# Patient Record
Sex: Male | Born: 1955
Health system: Southern US, Community
[De-identification: ages and names within clinical notes are randomized; demographics above are authoritative.]

## PROBLEM LIST (undated history)

## (undated) DIAGNOSIS — G8929 Other chronic pain: Secondary | ICD-10-CM

## (undated) DIAGNOSIS — Z87442 Personal history of urinary calculi: Secondary | ICD-10-CM

## (undated) DIAGNOSIS — R39198 Other difficulties with micturition: Secondary | ICD-10-CM

## (undated) DIAGNOSIS — M545 Low back pain, unspecified: Secondary | ICD-10-CM

## (undated) DIAGNOSIS — D759 Disease of blood and blood-forming organs, unspecified: Secondary | ICD-10-CM

## (undated) DIAGNOSIS — R3912 Poor urinary stream: Secondary | ICD-10-CM

## (undated) DIAGNOSIS — R519 Headache, unspecified: Secondary | ICD-10-CM

## (undated) DIAGNOSIS — G43909 Migraine, unspecified, not intractable, without status migrainosus: Secondary | ICD-10-CM

## (undated) DIAGNOSIS — M1990 Unspecified osteoarthritis, unspecified site: Secondary | ICD-10-CM

## (undated) DIAGNOSIS — R079 Chest pain, unspecified: Secondary | ICD-10-CM

## (undated) DIAGNOSIS — G2581 Restless legs syndrome: Secondary | ICD-10-CM

## (undated) DIAGNOSIS — J189 Pneumonia, unspecified organism: Secondary | ICD-10-CM

## (undated) DIAGNOSIS — K219 Gastro-esophageal reflux disease without esophagitis: Secondary | ICD-10-CM

## (undated) DIAGNOSIS — R112 Nausea with vomiting, unspecified: Secondary | ICD-10-CM

## (undated) DIAGNOSIS — K259 Gastric ulcer, unspecified as acute or chronic, without hemorrhage or perforation: Secondary | ICD-10-CM

## (undated) DIAGNOSIS — Z9889 Other specified postprocedural states: Secondary | ICD-10-CM

## (undated) DIAGNOSIS — R51 Headache: Secondary | ICD-10-CM

## (undated) DIAGNOSIS — R310 Gross hematuria: Secondary | ICD-10-CM

## (undated) DIAGNOSIS — I10 Essential (primary) hypertension: Secondary | ICD-10-CM

## (undated) HISTORY — PX: CERVICAL FUSION: SHX112

## (undated) HISTORY — DX: Headache: R51

## (undated) HISTORY — DX: Gross hematuria: R31.0

## (undated) HISTORY — DX: Low back pain, unspecified: M54.50

## (undated) HISTORY — DX: Restless legs syndrome: G25.81

## (undated) HISTORY — DX: Other difficulties with micturition: R39.198

## (undated) HISTORY — DX: Essential (primary) hypertension: I10

## (undated) HISTORY — DX: Gastric ulcer, unspecified as acute or chronic, without hemorrhage or perforation: K25.9

## (undated) HISTORY — PX: HERNIA REPAIR: SHX51

## (undated) HISTORY — DX: Poor urinary stream: R39.12

## (undated) HISTORY — DX: Headache, unspecified: R51.9

## (undated) HISTORY — DX: Chest pain, unspecified: R07.9

## (undated) HISTORY — PX: COLONOSCOPY: SHX5424

## (undated) HISTORY — DX: Migraine, unspecified, not intractable, without status migrainosus: G43.909

## (undated) HISTORY — DX: Other chronic pain: G89.29

---

## 1998-01-21 ENCOUNTER — Encounter: Admission: RE | Admit: 1998-01-21 | Discharge: 1998-01-21 | Payer: Self-pay | Admitting: Family Medicine

## 1998-03-12 ENCOUNTER — Encounter: Admission: RE | Admit: 1998-03-12 | Discharge: 1998-03-12 | Payer: Self-pay | Admitting: *Deleted

## 1998-03-25 ENCOUNTER — Encounter: Admission: RE | Admit: 1998-03-25 | Discharge: 1998-03-25 | Payer: Self-pay | Admitting: *Deleted

## 1998-11-26 ENCOUNTER — Encounter: Admission: RE | Admit: 1998-11-26 | Discharge: 1998-11-26 | Payer: Self-pay | Admitting: Family Medicine

## 2000-03-25 ENCOUNTER — Encounter: Admission: RE | Admit: 2000-03-25 | Discharge: 2000-03-25 | Payer: Self-pay | Admitting: Family Medicine

## 2000-07-08 ENCOUNTER — Encounter: Admission: RE | Admit: 2000-07-08 | Discharge: 2000-07-08 | Payer: Self-pay | Admitting: Family Medicine

## 2000-09-08 ENCOUNTER — Encounter: Admission: RE | Admit: 2000-09-08 | Discharge: 2000-09-08 | Payer: Self-pay | Admitting: Family Medicine

## 2001-08-03 ENCOUNTER — Encounter: Admission: RE | Admit: 2001-08-03 | Discharge: 2001-08-03 | Payer: Self-pay | Admitting: Family Medicine

## 2001-12-20 ENCOUNTER — Encounter: Admission: RE | Admit: 2001-12-20 | Discharge: 2001-12-20 | Payer: Self-pay | Admitting: Family Medicine

## 2002-05-08 ENCOUNTER — Encounter (HOSPITAL_BASED_OUTPATIENT_CLINIC_OR_DEPARTMENT_OTHER): Payer: Self-pay | Admitting: General Surgery

## 2002-05-10 ENCOUNTER — Ambulatory Visit (HOSPITAL_COMMUNITY): Admission: RE | Admit: 2002-05-10 | Discharge: 2002-05-10 | Payer: Self-pay | Admitting: General Surgery

## 2002-10-04 ENCOUNTER — Encounter: Admission: RE | Admit: 2002-10-04 | Discharge: 2002-10-04 | Payer: Self-pay | Admitting: Family Medicine

## 2003-04-11 ENCOUNTER — Encounter: Admission: RE | Admit: 2003-04-11 | Discharge: 2003-04-11 | Payer: Self-pay | Admitting: Sports Medicine

## 2003-07-28 HISTORY — PX: NASAL SINUS SURGERY: SHX719

## 2003-11-07 ENCOUNTER — Encounter: Admission: RE | Admit: 2003-11-07 | Discharge: 2003-11-07 | Payer: Self-pay | Admitting: Family Medicine

## 2003-11-23 ENCOUNTER — Encounter: Admission: RE | Admit: 2003-11-23 | Discharge: 2003-11-23 | Payer: Self-pay | Admitting: Sports Medicine

## 2003-11-30 ENCOUNTER — Encounter: Admission: RE | Admit: 2003-11-30 | Discharge: 2003-11-30 | Payer: Self-pay | Admitting: Family Medicine

## 2004-06-04 ENCOUNTER — Ambulatory Visit: Payer: Self-pay | Admitting: Family Medicine

## 2004-07-01 ENCOUNTER — Ambulatory Visit: Payer: Self-pay | Admitting: Sports Medicine

## 2004-12-20 ENCOUNTER — Emergency Department (HOSPITAL_COMMUNITY): Admission: EM | Admit: 2004-12-20 | Discharge: 2004-12-20 | Payer: Self-pay | Admitting: Emergency Medicine

## 2005-01-13 ENCOUNTER — Ambulatory Visit: Payer: Self-pay | Admitting: Family Medicine

## 2005-02-02 ENCOUNTER — Ambulatory Visit: Payer: Self-pay | Admitting: Family Medicine

## 2005-03-03 ENCOUNTER — Ambulatory Visit: Payer: Self-pay | Admitting: Family Medicine

## 2005-03-23 ENCOUNTER — Ambulatory Visit: Payer: Self-pay | Admitting: Family Medicine

## 2005-07-09 ENCOUNTER — Ambulatory Visit: Payer: Self-pay | Admitting: Sports Medicine

## 2006-09-10 ENCOUNTER — Ambulatory Visit: Payer: Self-pay | Admitting: Family Medicine

## 2006-09-23 DIAGNOSIS — G43909 Migraine, unspecified, not intractable, without status migrainosus: Secondary | ICD-10-CM | POA: Insufficient documentation

## 2006-09-23 DIAGNOSIS — Z8719 Personal history of other diseases of the digestive system: Secondary | ICD-10-CM

## 2006-09-23 DIAGNOSIS — I10 Essential (primary) hypertension: Secondary | ICD-10-CM | POA: Insufficient documentation

## 2006-09-23 DIAGNOSIS — J309 Allergic rhinitis, unspecified: Secondary | ICD-10-CM | POA: Insufficient documentation

## 2006-09-23 DIAGNOSIS — Z8711 Personal history of peptic ulcer disease: Secondary | ICD-10-CM | POA: Insufficient documentation

## 2006-11-23 ENCOUNTER — Telehealth: Payer: Self-pay | Admitting: *Deleted

## 2006-11-29 ENCOUNTER — Ambulatory Visit: Payer: Self-pay | Admitting: Family Medicine

## 2006-12-27 ENCOUNTER — Ambulatory Visit: Payer: Self-pay | Admitting: Sports Medicine

## 2007-01-06 ENCOUNTER — Encounter: Payer: Self-pay | Admitting: Family Medicine

## 2007-01-10 ENCOUNTER — Ambulatory Visit: Payer: Self-pay | Admitting: Family Medicine

## 2007-01-13 ENCOUNTER — Encounter: Payer: Self-pay | Admitting: Family Medicine

## 2007-01-18 ENCOUNTER — Encounter: Payer: Self-pay | Admitting: Family Medicine

## 2007-01-20 ENCOUNTER — Encounter (INDEPENDENT_AMBULATORY_CARE_PROVIDER_SITE_OTHER): Payer: Self-pay | Admitting: *Deleted

## 2007-01-20 ENCOUNTER — Encounter: Payer: Self-pay | Admitting: Family Medicine

## 2007-02-28 ENCOUNTER — Telehealth: Payer: Self-pay | Admitting: *Deleted

## 2007-05-13 ENCOUNTER — Encounter: Payer: Self-pay | Admitting: Family Medicine

## 2007-06-08 ENCOUNTER — Telehealth: Payer: Self-pay | Admitting: *Deleted

## 2007-07-18 ENCOUNTER — Ambulatory Visit: Payer: Self-pay | Admitting: Sports Medicine

## 2007-07-18 ENCOUNTER — Encounter: Payer: Self-pay | Admitting: Family Medicine

## 2007-07-18 DIAGNOSIS — M542 Cervicalgia: Secondary | ICD-10-CM | POA: Insufficient documentation

## 2007-07-18 LAB — CONVERTED CEMR LAB
ALT: 34 U/L
AST: 25 U/L
Albumin: 4.7 g/dL
Alkaline Phosphatase: 61 U/L
BUN: 13 mg/dL
CO2: 26 meq/L
Calcium: 9.4 mg/dL
Chloride: 104 meq/L
Creatinine, Ser: 0.98 mg/dL
Direct LDL: 173 mg/dL — ABNORMAL HIGH
Glucose, Bld: 79 mg/dL
Potassium: 4.5 meq/L
Sodium: 142 meq/L
TSH: 1.689 u[IU]/mL
Total Bilirubin: 0.5 mg/dL
Total Protein: 7.2 g/dL

## 2007-08-01 ENCOUNTER — Encounter: Payer: Self-pay | Admitting: Family Medicine

## 2007-11-18 ENCOUNTER — Encounter: Payer: Self-pay | Admitting: *Deleted

## 2007-11-30 ENCOUNTER — Ambulatory Visit: Payer: Self-pay | Admitting: Family Medicine

## 2008-01-02 ENCOUNTER — Ambulatory Visit: Payer: Self-pay | Admitting: Family Medicine

## 2008-01-02 ENCOUNTER — Encounter: Payer: Self-pay | Admitting: Family Medicine

## 2008-01-02 LAB — CONVERTED CEMR LAB
Cholesterol: 180 mg/dL (ref 0–200)
HDL: 40 mg/dL (ref >39)
LDL Cholesterol: 105 mg/dL — ABNORMAL HIGH (ref 0–99)
PSA: 0.52 ng/mL (ref 0.10–4.00)
Total CHOL/HDL Ratio: 4.5
Triglycerides: 175 mg/dL — ABNORMAL HIGH (ref <150)
VLDL: 35 mg/dL (ref 0–40)

## 2008-01-04 ENCOUNTER — Encounter: Payer: Self-pay | Admitting: Family Medicine

## 2008-04-20 ENCOUNTER — Ambulatory Visit: Payer: Self-pay | Admitting: Family Medicine

## 2008-06-15 ENCOUNTER — Encounter: Payer: Self-pay | Admitting: Family Medicine

## 2008-10-26 ENCOUNTER — Encounter: Payer: Self-pay | Admitting: Family Medicine

## 2008-10-26 ENCOUNTER — Ambulatory Visit: Payer: Self-pay | Admitting: Family Medicine

## 2008-10-26 LAB — CONVERTED CEMR LAB
BUN: 22 mg/dL (ref 6–23)
CO2: 24 meq/L (ref 19–32)
Calcium: 8.6 mg/dL (ref 8.4–10.5)
Chloride: 110 meq/L (ref 96–112)
Creatinine, Ser: 0.94 mg/dL (ref 0.40–1.50)
Glucose, Bld: 141 mg/dL — ABNORMAL HIGH (ref 70–99)
Magnesium: 1.9 mg/dL (ref 1.5–2.5)
Potassium: 3.9 meq/L (ref 3.5–5.3)
Sodium: 143 meq/L (ref 135–145)

## 2008-10-30 ENCOUNTER — Telehealth: Payer: Self-pay | Admitting: Family Medicine

## 2009-03-12 ENCOUNTER — Ambulatory Visit: Payer: Self-pay | Admitting: Family Medicine

## 2009-03-14 ENCOUNTER — Ambulatory Visit: Payer: Self-pay | Admitting: Family Medicine

## 2009-03-14 ENCOUNTER — Encounter: Payer: Self-pay | Admitting: Family Medicine

## 2009-03-15 LAB — CONVERTED CEMR LAB
ALT: 26 units/L (ref 0–53)
AST: 21 units/L (ref 0–37)
Albumin: 4.1 g/dL (ref 3.5–5.2)
Alkaline Phosphatase: 50 units/L (ref 39–117)
BUN: 15 mg/dL (ref 6–23)
CO2: 22 meq/L (ref 19–32)
Calcium: 8.5 mg/dL (ref 8.4–10.5)
Chloride: 106 meq/L (ref 96–112)
Cholesterol: 188 mg/dL (ref 0–200)
Creatinine, Ser: 0.84 mg/dL (ref 0.40–1.50)
Glucose, Bld: 102 mg/dL — ABNORMAL HIGH (ref 70–99)
HCT: 47.4 % (ref 39.0–52.0)
HDL: 42 mg/dL (ref >39)
Hemoglobin: 15.6 g/dL (ref 13.0–17.0)
LDL Cholesterol: 104 mg/dL — ABNORMAL HIGH (ref 0–99)
MCHC: 32.9 g/dL (ref 30.0–36.0)
MCV: 87.6 fL (ref 78.0–100.0)
PSA: 0.45 ng/mL (ref 0.10–4.00)
Platelets: 166 10*3/uL (ref 150–400)
Potassium: 4.6 meq/L (ref 3.5–5.3)
RBC: 5.41 M/uL (ref 4.22–5.81)
RDW: 13.5 % (ref 11.5–15.5)
Sodium: 141 meq/L (ref 135–145)
Total Bilirubin: 0.3 mg/dL (ref 0.3–1.2)
Total CHOL/HDL Ratio: 4.5
Total Protein: 6.4 g/dL (ref 6.0–8.3)
Triglycerides: 208 mg/dL — ABNORMAL HIGH (ref <150)
VLDL: 42 mg/dL — ABNORMAL HIGH (ref 0–40)
WBC: 4.6 10*3/uL (ref 4.0–10.5)

## 2009-04-08 ENCOUNTER — Ambulatory Visit: Payer: Self-pay | Admitting: Family Medicine

## 2009-04-08 ENCOUNTER — Encounter: Payer: Self-pay | Admitting: Family Medicine

## 2009-04-10 ENCOUNTER — Telehealth: Payer: Self-pay | Admitting: Family Medicine

## 2009-06-12 ENCOUNTER — Ambulatory Visit: Payer: Self-pay | Admitting: Family Medicine

## 2009-06-12 ENCOUNTER — Encounter: Payer: Self-pay | Admitting: Family Medicine

## 2009-06-13 LAB — CONVERTED CEMR LAB
BUN: 17 mg/dL (ref 6–23)
CO2: 24 meq/L (ref 19–32)
Calcium: 9.3 mg/dL (ref 8.4–10.5)
Chloride: 107 meq/L (ref 96–112)
Creatinine, Ser: 1.01 mg/dL (ref 0.40–1.50)
Glucose, Bld: 84 mg/dL (ref 70–99)
Potassium: 4.2 meq/L (ref 3.5–5.3)
Sodium: 141 meq/L (ref 135–145)

## 2009-07-02 ENCOUNTER — Ambulatory Visit: Payer: Self-pay | Admitting: Family Medicine

## 2009-07-16 ENCOUNTER — Ambulatory Visit: Payer: Self-pay | Admitting: Family Medicine

## 2009-07-16 ENCOUNTER — Encounter: Payer: Self-pay | Admitting: Family Medicine

## 2009-07-17 LAB — CONVERTED CEMR LAB
BUN: 19 mg/dL (ref 6–23)
CO2: 29 meq/L (ref 19–32)
Calcium: 9.7 mg/dL (ref 8.4–10.5)
Chloride: 99 meq/L (ref 96–112)
Creatinine, Ser: 0.88 mg/dL (ref 0.40–1.50)
Glucose, Bld: 84 mg/dL (ref 70–99)
Potassium: 4 meq/L (ref 3.5–5.3)
Sodium: 140 meq/L (ref 135–145)

## 2009-08-30 ENCOUNTER — Telehealth: Payer: Self-pay | Admitting: Family Medicine

## 2009-11-26 ENCOUNTER — Telehealth: Payer: Self-pay | Admitting: Family Medicine

## 2009-12-11 ENCOUNTER — Ambulatory Visit: Payer: Self-pay | Admitting: Family Medicine

## 2009-12-11 ENCOUNTER — Encounter: Payer: Self-pay | Admitting: Family Medicine

## 2009-12-11 DIAGNOSIS — S81809A Unspecified open wound, unspecified lower leg, initial encounter: Secondary | ICD-10-CM | POA: Insufficient documentation

## 2009-12-11 DIAGNOSIS — S81009A Unspecified open wound, unspecified knee, initial encounter: Secondary | ICD-10-CM | POA: Insufficient documentation

## 2009-12-11 DIAGNOSIS — S91009A Unspecified open wound, unspecified ankle, initial encounter: Secondary | ICD-10-CM

## 2009-12-12 LAB — CONVERTED CEMR LAB
BUN: 16 mg/dL (ref 6–23)
CO2: 24 meq/L (ref 19–32)
Calcium: 9.4 mg/dL (ref 8.4–10.5)
Chloride: 102 meq/L (ref 96–112)
Creatinine, Ser: 0.88 mg/dL (ref 0.40–1.50)
Glucose, Bld: 82 mg/dL (ref 70–99)
Potassium: 3.9 meq/L (ref 3.5–5.3)
Sodium: 141 meq/L (ref 135–145)

## 2010-02-11 ENCOUNTER — Telehealth (INDEPENDENT_AMBULATORY_CARE_PROVIDER_SITE_OTHER): Payer: Self-pay | Admitting: *Deleted

## 2010-04-02 ENCOUNTER — Ambulatory Visit: Payer: Self-pay | Admitting: Family Medicine

## 2010-05-20 ENCOUNTER — Ambulatory Visit: Payer: Self-pay | Admitting: Family Medicine

## 2010-06-27 ENCOUNTER — Encounter (INDEPENDENT_AMBULATORY_CARE_PROVIDER_SITE_OTHER): Payer: Self-pay | Admitting: *Deleted

## 2010-07-15 ENCOUNTER — Encounter: Payer: Self-pay | Admitting: Family Medicine

## 2010-08-28 NOTE — Progress Notes (Signed)
Summary: Amerge 2.5mg  prn #10 x0  Phone Note Refill Request Call back at Home Phone [phone] Call back at [phone] Message from:  Patient  Refills Requested: Medication #1:  AMERGE 2.5 MG TABS Take 1 tablet by mouth [facility] [address] [facility] states they have sent a request in and hasn't heard from [place] yet.   Initial call taken by: De Nurse,  August 30, 2009 10:25 AM  Follow-up for Phone Call        to pcp Follow-up by: Golden Circle RN,  August 30, 2009 10:58 AM  Additional Follow-up for Phone Call Additional follow up Details #1::        Pt calling again about his refill. Additional Follow-up by: Clydell Hakim,  September 02, 2009 1:50 PM    New/Updated Medications: AMERGE 2.5 MG TABS (NARATRIPTAN HCL) Take 1 tablet by mouth as needed for migraine, may repeat x 1 after 4hr if partial response or HA recurs Prescriptions: AMERGE 2.5 MG TABS (NARATRIPTAN HCL) Take 1 tablet by mouth as needed for migraine, may repeat x 1 after 4hr if partial response or HA recurs  #10 x 0   Entered and Authorized by:   Marisue Ivan  MD   Signed by:   Marisue Ivan  MD on 09/02/2009   Method used:   [facility] to        [facility]  [address] [id number]       [address]       [place], [place]  [address]       Ph: [phone] or [phone]       Fax: [phone]   RxID:   [id number]

## 2010-08-28 NOTE — Progress Notes (Signed)
Summary: phn msg  Phone Note Call from Patient Call back at [facility] Phone [phone]   Caller: Patient Summary of Call: Pt states he is returning Lynn's call. Initial call taken by: Clydell Hakim,  February 11, 2010 11:29 AM  Follow-up for Phone Call        spoke with patient an advised that rx that were received from pharmacy were refilled but please call and schedule appointment with new MD soon as Dr. Burnadette Pop had suggetsed follow up at last office visit. he voices understanding. Follow-up by: Theresia Lo RN,  February 11, 2010 12:28 PM

## 2010-08-28 NOTE — Miscellaneous (Signed)
Summary: sent medical records  Clinical Lists Changes  1 year of med recs sent to [facility] for eval of pt for Concealed Handgun Permit. De Nurse  June 27, 2010 12:13 PM

## 2010-08-28 NOTE — Progress Notes (Signed)
Summary: Inquire of BP  Phone Note Outgoing Call   Call placed by: Marisue Ivan  MD,  Nov 26, 2009 9:54 AM Summary of Call: Called patient to inquire about toleration of inc HCTZ and control of BP.  No answer.  Left message that I would refill the med x 3 months and he needs to call back to let us know how his BP is doing.  He will need an office visit soon to reassess. Initial call taken by: Marisue Ivan  MD,  Nov 26, 2009 9:55 AM

## 2010-08-28 NOTE — Miscellaneous (Signed)
Summary: Doxepin refill  Clinical Lists Changes  Medications: Rx of DOXEPIN HCL 50 MG CAPS (DOXEPIN HCL) 1 tab by mouth daily;  #90 x 3;  Signed;  Entered by: Majel Homer MD;  Authorized by: Majel Homer MD;  Method used: [facility]  [address] [id number] [address] [place], [place], [place]  [address], Ph: [phone] or [phone], Fax: [phone]    Prescriptions: DOXEPIN HCL 50 MG CAPS (DOXEPIN HCL) 1 tab by mouth daily  #90 x 3   Entered and Authorized by:   Majel Homer MD   Signed by:   Majel Homer MD on 07/15/2010   Method used:   [facility]        [facility]  [address] [id number]       [address]       [place], [place]  [address]       Ph: [phone] or [phone]       Fax: [phone]   RxID:   [id number]

## 2010-08-28 NOTE — Assessment & Plan Note (Signed)
Summary: f/u htn and new leg wound   Vital Signs:  Patient profile:   55 year old male Height:      68 inches Weight:      158.9 pounds BMI:     24.25 Temp:     98.1 degrees F oral Pulse rate:   62 / minute BP sitting:   124 / 82  (left arm) Cuff size:   regular  Vitals Entered By: Gladstone Pih (Dec 11, 2009 4:30 PM) CC: F/U HTN Is Patient Diabetic? No Pain Assessment Patient in pain? no        Primary Care Provider:  Marisue Ivan  MD  CC:  F/U HTN.  History of Present Illness: 55yo M here for f/u htn  HTN: No adverse effects from medication.  Checking it regularly and ranging b/w 110-130s systolic and 70-90s diastolic.  No dizziness, HA, CP, palpitations, or swelling.  Concerned b/c he has strict requirements by his job.  Leg abrasion: Blunt trauma to his shins 1 week ago.  Has a wound on both shins that are healing but has developed redness and pain of the skin adjacent to the wound.  No drainage, fever, or chills.  Brusing present draining to his ankles.   Habits & Providers  Alcohol-Tobacco-Diet     Tobacco Status: never  Current Medications (verified): 1)  Amerge 2.5 Mg Tabs (Naratriptan Hcl) .... Take 1 Tablet By Mouth As Needed For Migraine, May Repeat X 1 After 4hr If Partial Response or Ha Recurs 2)  Doxepin Hcl 50 Mg Caps (Doxepin Hcl) .Marland Kitchen.. 1 Tab By Mouth Daily 3)  Methocarbamol 500 Mg Tabs (Methocarbamol) .... Take 1 Tablet By Mouth At Bedtime As Needed For Muscle Spasms 4)  Nadolol 160 Mg Tabs (Nadolol) .Marland Kitchen.. 1 Tab By Mouth Daily 5)  Flonase 50 Mcg/act  Susp (Fluticasone Propionate) .... 2 Sprays Each Nostril Daily 6)  Ropinirole Hcl 0.25 Mg Tabs (Ropinirole Hcl) .Marland Kitchen.. 1 Tab By Mouth 1-2 Hours Before Bedtime For Restless Leg Syndrome As Needed 7)  Hydrochlorothiazide 25 Mg Tabs (Hydrochlorothiazide) .... 1/2 Tab By Mouth Daily For Blood Pressure 8)  Aspir-Low 81 Mg Tbec (Aspirin) .Marland Kitchen.. 1 Tablet By Mouth Daily 9)  Lisinopril 10 Mg Tabs (Lisinopril) .Marland Kitchen..  1 Tab By Mouth Daily 10)  Doxycycline Hyclate 100 Mg Tabs (Doxycycline Hyclate) .Marland Kitchen.. 1 Tab By Mouth Two Times A Day X 10 Days  Allergies (verified): 1)  ! Prozac 2)  ! Quinine  Review of Systems      See HPI  Physical Exam  General:  VS reviewed.  Well appearing, NAD Lungs:  Normal respiratory effort, chest expands symmetrically. Lungs are clear to auscultation, no crackles or wheezes. Heart:  Normal rate and regular rhythm. S1 and S2 normal without gallop, murmur, click, rub or other extra sounds. Extremities:  no edema 10 cm linear abrasion on the right that is scabbed over and dry with moderate surrounding erythema that is mildly ttp 5cm linear abrasio non the left that is the same as above   Impression & Recommendations:  Problem # 1:  HYPERTENSION, BENIGN SYSTEMIC (ICD-401.1) Assessment Unchanged Essentially at goal (<140/90) Has occasional readings that are elevated.  Plan to dec the HCTZ to 12.5mg  and start on lisinopril 10mg  with plans to reassess in 2 weeks and combine the meds if tolerated and effectie. Will check BMET today and in 2 weeks.  His updated medication list for this problem includes:    Nadolol 160 Mg Tabs (Nadolol) .Marland KitchenMarland KitchenMarland KitchenMarland Kitchen 1  tab by mouth daily    Hydrochlorothiazide 25 Mg Tabs (Hydrochlorothiazide) .Marland Kitchen... 1/2 tab by mouth daily for blood pressure    Lisinopril 10 Mg Tabs (Lisinopril) .Marland Kitchen... 1 tab by mouth daily  Orders: T-Basic Metabolic Panel [id number]) [facility]- Est Level  3 ([address])  Problem # 2:  WOUND, LEG (ICD-891.0) Assessment: New  Appears to be healing but may be developing acute early cellulitis.  Will provide a rx for doxy in case it worsens.  Orders: [facility]- Est Level  3 ([address])  Complete Medication List: 1)  Amerge 2.5 Mg Tabs (Naratriptan hcl) .... Take 1 tablet by mouth as needed for migraine, may repeat x 1 after 4hr if partial response or ha recurs 2)  Doxepin Hcl 50 Mg Caps (Doxepin hcl) .Marland Kitchen.. 1 tab by mouth daily 3)  Methocarbamol 500 Mg  Tabs (Methocarbamol) .... Take 1 tablet by mouth at bedtime as needed for muscle spasms 4)  Nadolol 160 Mg Tabs (Nadolol) .Marland Kitchen.. 1 tab by mouth daily 5)  Flonase 50 Mcg/act Susp (Fluticasone propionate) .... 2 sprays each nostril daily 6)  Ropinirole Hcl 0.25 Mg Tabs (Ropinirole hcl) .Marland Kitchen.. 1 tab by mouth 1-2 hours before bedtime for restless leg syndrome as needed 7)  Hydrochlorothiazide 25 Mg Tabs (Hydrochlorothiazide) .... 1/2 tab by mouth daily for blood pressure 8)  Aspir-low 81 Mg Tbec (Aspirin) .Marland Kitchen.. 1 tablet by mouth daily 9)  Lisinopril 10 Mg Tabs (Lisinopril) .Marland Kitchen.. 1 tab by mouth daily 10)  Doxycycline Hyclate 100 Mg Tabs (Doxycycline hyclate) .Marland Kitchen.. 1 tab by mouth two times a day x 10 days  Patient Instructions: 1)  Call me in 2 weeks to discuss your blood pressure and we'll discuss combining the lisinopril and HCTZ. 2)  Today we started you on lisinopril for better bp control. 3)  I gave you an antibiotic called doxycycline in case your leg appears worse or if you have pain and fever 4)  we'll check your kidney function in 2 weeks. Prescriptions: DOXYCYCLINE HYCLATE 100 MG TABS (DOXYCYCLINE HYCLATE) 1 tab by mouth two times a day x 10 days  #20 x 0   Entered and Authorized by:   Marisue Ivan  MD   Signed by:   Marisue Ivan  MD on 12/11/2009   Method used:   [facility]  [address] [id number]       [address]       [place], [place]  [address]       Ph: [phone] or [phone]       Fax: [phone]   RxID:   [id number] LISINOPRIL 10 MG TABS (LISINOPRIL) 1 tab by mouth daily  #30 x 1   Entered and Authorized by:   Marisue Ivan  MD   Signed by:   Marisue Ivan  MD on 12/11/2009   Method used:   [facility]        [facility]  [address] [id number]       [address]       [place], [place]  [address]       Ph: [phone] or [phone]       Fax: [phone]   RxID:    [id number]    Prevention & Chronic Care Immunizations   Influenza vaccine: Fluvax 3+  (06/12/2009)   Influenza vaccine due: 04/20/2009    Tetanus booster: 07/18/2007: Tdap  Tetanus booster due: 07/17/2017    Pneumococcal vaccine: Not documented  Colorectal Screening   Hemoccult: Done.  (05/27/2004)   Hemoccult due: Not Indicated    Colonoscopy: Small internal hemorrhoids Location:  [facility].     (06/06/2008)   Colonoscopy action/deferral: Repeat colonoscopy in 10 years.    (06/06/2008)   Colonoscopy due: 06/06/2018  Other Screening   PSA: 0.45  (03/14/2009)   PSA due due: 01/01/2009   Smoking status: never  (12/11/2009)  Lipids   Total Cholesterol: 188  (03/14/2009)   LDL: 104  (03/14/2009)   LDL Direct: 173  (07/18/2007)   HDL: 42  (03/14/2009)   Triglycerides: 208  (03/14/2009)  Hypertension   Last Blood Pressure: 124 / 82  (12/11/2009)   Serum creatinine: 0.88  (07/16/2009)   BMP action: Ordered   Serum potassium 4.0  (07/16/2009)    Hypertension flowsheet reviewed?: Yes   Progress toward BP goal: At goal  Self-Management Support :   Personal Goals (by the next clinic visit) :      Personal blood pressure goal: 140/90  (06/12/2009)   Patient will work on the following items until the next clinic visit to reach self-care goals:     Medications and monitoring: take my medicines every day, check my blood pressure, bring all of my medications to every visit  (12/11/2009)     Eating: drink diet soda or water instead of juice or soda, eat more vegetables, use fresh or frozen vegetables, eat foods that are low in salt, eat baked foods instead of fried foods, eat fruit for snacks and desserts, limit or avoid alcohol  (12/11/2009)    Hypertension self-management support: BP self-monitoring log, Written self-care plan, Education handout  (12/11/2009)   Hypertension self-care plan printed.   Hypertension education handout printed

## 2010-08-28 NOTE — Assessment & Plan Note (Signed)
Summary: flu shot,df  Nurse Visit   Vital Signs:  Patient profile:   55 year old male Temp:     98.6 degrees F  Vitals Entered By: Theresia Lo RN (May 20, 2010 11:51 AM)  Allergies: 1)  ! Prozac 2)  ! Quinine  Immunizations Administered:  Influenza Vaccine # 1:    Vaccine Type: Fluvax 3+    Site: left deltoid    Mfr: [employer]    Dose: 0.5 ml    Route: IM    Given by: Theresia Lo RN    Exp. Date: 01/21/2011    Lot #: [id number]    VIS given: 02/18/10 version given May 20, 2010.  Flu Vaccine Consent Questions:    Do you have a history of severe allergic reactions to this vaccine? no    Any prior history of allergic reactions to egg and/or gelatin? no    Do you have a sensitivity to the preservative Thimersol? no    Do you have a past history of Guillan-Barre Syndrome? no    Do you currently have an acute febrile illness? no    Have you ever had a severe reaction to latex? no    Vaccine information given and explained to patient? yes  Orders Added: 1)  Flu Vaccine 1yrs + [90658] 2)  Admin 1st Vaccine [28413]

## 2010-08-28 NOTE — Assessment & Plan Note (Signed)
Summary: meet new doc,df   Vital Signs:  Patient profile:   55 year old male Weight:      160 pounds Temp:     99.1 degrees F oral Pulse rate:   63 / minute Pulse rhythm:   regular BP sitting:   110 / 73  (left arm) Cuff size:   regular  Vitals Entered By: Loralee Pacas CMA (April 02, 2010 2:18 PM)   Primary Provider:  Majel Homer MD   History of Present Illness: Pleasant 55 year old gentleman who presents for an annual physical exam and medication refills.  He reports no new health problems, no chest pain, no shortness of breath, no changes in his chronic left upper extremity pain, and no changes in his chronic migraines.  His blood pressure has remained well controlled on lisinopril, and he reports that his doxepin and nadolol continue to work well for migraine prophylaxsis (he only uses rescue medications  ~once per month).  Habits & Providers  Alcohol-Tobacco-Diet     Tobacco Status: never  Current Medications (verified): 1)  Amerge 2.5 Mg Tabs (Naratriptan Hcl) .... Take 1 Tablet By Mouth As Needed For Migraine, May Repeat X 1 After 4hr If Partial Response or Ha Recurs 2)  Doxepin Hcl 50 Mg Caps (Doxepin Hcl) .Marland Kitchen.. 1 Tab By Mouth Daily 3)  Methocarbamol 500 Mg Tabs (Methocarbamol) .... Take 1 Tablet By Mouth At Bedtime As Needed For Muscle Spasms 4)  Nadolol 160 Mg Tabs (Nadolol) .Marland Kitchen.. 1 Tab By Mouth Daily 5)  Flonase 50 Mcg/act  Susp (Fluticasone Propionate) .... 2 Sprays Each Nostril Daily 6)  Ropinirole Hcl 0.25 Mg Tabs (Ropinirole Hcl) .Marland Kitchen.. 1 Tab By Mouth 1-2 Hours Before Bedtime For Restless Leg Syndrome As Needed 7)  Aspir-Low 81 Mg Tbec (Aspirin) .Marland Kitchen.. 1 Tablet By Mouth Daily 8)  Lisinopril 10 Mg Tabs (Lisinopril) .Marland Kitchen.. 1 Tab By Mouth Daily 9)  Doxycycline Hyclate 100 Mg Tabs (Doxycycline Hyclate) .Marland Kitchen.. 1 Tab By Mouth Two Times A Day X 10 Days 10)  Etodolac 200 Mg Caps (Etodolac)  Allergies: 1)  ! Prozac 2)  ! Quinine PMH-FH-SH reviewed for relevance  Review of  Systems       The patient complains of headaches.  The patient denies chest pain, syncope, dyspnea on exertion, peripheral edema, melena, and hematuria.    Physical Exam  General:  Well-developed,well-nourished,in no acute distress; alert,appropriate and cooperative throughout examination Head:  Normocephalic and atraumatic without obvious abnormalities. No apparent alopecia or balding. Nose:  External nasal examination shows no deformity or inflammation. Nasal mucosa are pink and moist without lesions or exudates. Mouth:  Oral mucosa and oropharynx without lesions or exudates.  Teeth in good repair. Neck:  No deformities, masses, or tenderness noted. Lungs:  Normal respiratory effort, chest expands symmetrically. Lungs are clear to auscultation, no crackles or wheezes. Heart:  Normal rate and regular rhythm. S1 and S2 normal without gallop, murmur, click, rub or other extra sounds. Abdomen:  Bowel sounds positive,abdomen soft and non-tender without masses, organomegaly or hernias noted. Msk:  No deformity or scoliosis noted of thoracic or lumbar spine.   Extremities:  No clubbing, cyanosis, edema, or deformity noted with normal full range of motion of all joints.     Impression & Recommendations:  Problem # 1:  HYPERTENSION, BENIGN SYSTEMIC (ICD-401.1) Assessment Improved  The patient brought his blood pressure log with him today and his blood pressure seems to be very well controlled on his lisinopril.  He  expresses a preference for this over HCTZ.  We discussed the fact that he is well controlled on his current medication and that we can stop the HCTZ.  The following medications were removed from the medication list:    Hydrochlorothiazide 25 Mg Tabs (Hydrochlorothiazide) .Marland Kitchen... 1/2 tab by mouth daily for blood pressure His updated medication list for this problem includes:    Nadolol 160 Mg Tabs (Nadolol) .Marland Kitchen... 1 tab by mouth daily    Lisinopril 10 Mg Tabs (Lisinopril) .Marland Kitchen... 1 tab by  mouth daily  Orders: [facility]- Est Level  3 ([address])  Problem # 2:  CERVICAL RADICULOPATHY (ICD-723.4) Assessment: Unchanged The patient says that his pain remains unchanged from his previous visit.  He is not interested in surgery at this time and understands the need for PT exercises.  He says he does these regularly and does not express interest in any other interventions at this time.  Will continue him on his methocarbamol as needed for the pain.  Problem # 3:  HEALTH MAINTENANCE EXAM (ICD-V70.0)  Discussed with the patient that he is up to date on his screening exams and that he is not due for any blood work at this time.  He was instructed to call the clinic in one month to set up a time to get his annual flu shot.  Orders: [facility]- Est Level  3 ([address])  Complete Medication List: 1)  Amerge 2.5 Mg Tabs (Naratriptan hcl) .... Take 1 tablet by mouth as needed for migraine, may repeat x 1 after 4hr if partial response or ha recurs 2)  Doxepin Hcl 50 Mg Caps (Doxepin hcl) .Marland Kitchen.. 1 tab by mouth daily 3)  Methocarbamol 500 Mg Tabs (Methocarbamol) .... Take 1 tablet by mouth at bedtime as needed for muscle spasms 4)  Nadolol 160 Mg Tabs (Nadolol) .Marland Kitchen.. 1 tab by mouth daily 5)  Flonase 50 Mcg/act Susp (Fluticasone propionate) .... 2 sprays each nostril daily 6)  Ropinirole Hcl 0.25 Mg Tabs (Ropinirole hcl) .Marland Kitchen.. 1 tab by mouth 1-2 hours before bedtime for restless leg syndrome as needed 7)  Aspir-low 81 Mg Tbec (Aspirin) .Marland Kitchen.. 1 tablet by mouth daily 8)  Lisinopril 10 Mg Tabs (Lisinopril) .Marland Kitchen.. 1 tab by mouth daily 9)  Doxycycline Hyclate 100 Mg Tabs (Doxycycline hyclate) .Marland Kitchen.. 1 tab by mouth two times a day x 10 days 10)  Etodolac 200 Mg Caps (Etodolac)  Patient Instructions: 1)  It was great to see you today! 2)  I have sent your prescriptions to the pharmacy. 3)  Please come back to see me in 6 months, or sooner if needed.   Prevention & Chronic Care Immunizations   Influenza vaccine: Fluvax 3+   (06/12/2009)   Influenza vaccine due: 04/20/2009    Tetanus booster: 07/18/2007: Tdap   Tetanus booster due: 07/17/2017    Pneumococcal vaccine: Not documented  Colorectal Screening   Hemoccult: Done.  (05/27/2004)   Hemoccult due: Not Indicated    Colonoscopy: Small internal hemorrhoids Location:  [facility].     (06/06/2008)   Colonoscopy action/deferral: Repeat colonoscopy in 10 years.    (06/06/2008)   Colonoscopy due: 06/06/2018  Other Screening   PSA: 0.45  (03/14/2009)   PSA due due: 01/01/2009   Smoking status: never  (04/02/2010)  Lipids   Total Cholesterol: 188  (03/14/2009)   LDL: 104  (03/14/2009)   LDL Direct: 173  (07/18/2007)   HDL: 42  (03/14/2009)   Triglycerides: 208  (03/14/2009)  Hypertension   Last  Blood Pressure: 110 / 73  (04/02/2010)   Serum creatinine: 0.88  (12/11/2009)   BMP action: Ordered   Serum potassium 3.9  (12/11/2009)    Hypertension flowsheet reviewed?: Yes   Progress toward BP goal: At goal  Self-Management Support :   Personal Goals (by the next clinic visit) :      Personal blood pressure goal: 140/90  (06/12/2009)   Hypertension self-management support: BP self-monitoring log, Written self-care plan, Education handout  (12/11/2009)

## 2010-11-12 ENCOUNTER — Other Ambulatory Visit: Payer: Self-pay | Admitting: Family Medicine

## 2010-11-12 MED ORDER — NARATRIPTAN HCL 2.5 MG PO TABS: ORAL_TABLET | ORAL | Status: DC

## 2010-11-12 NOTE — Telephone Encounter (Signed)
Additional refill requests prior to around July 1 will require appointment to talk about migraines.

## 2010-12-12 NOTE — Op Note (Signed)
NAME:  Jose Shea, Jose Shea                           ACCOUNT NO.:  [id number]   MEDICAL RECORD NO.:  [id number]                   PATIENT TYPE:  OIB   LOCATION:  2858                                 FACILITY:  [facility]   PHYSICIAN:  Leonie Man, MD                  DATE OF BIRTH:  [birth year]   DATE OF PROCEDURE:  05/10/2002  DATE OF DISCHARGE:  05/10/2002                                 OPERATIVE REPORT   PREOPERATIVE DIAGNOSIS:  Recurrent left inguinal hernia.   POSTOPERATIVE DIAGNOSIS:  Recurrent left inguinal hernia.   OPERATION PERFORMED:  Repair of recurrent left inguinal hernia with mesh.   SURGEON:  Leonie Man, MD   ASSISTANT:  Nurse   ANESTHESIA:  General.   INDICATIONS FOR SURGERY:  The patient is a 55 year old  man presenting with  a recurrent left inguinal hernia, presenting in the pubic tubercle.  He is  brought to the operating room now for repair following discussion of the  risks and potential benefits of surgery and he gave consent after all  questions were answered.   DESCRIPTION OF OPERATION:  Following the induction of satisfactory  anesthesia the patient was positioned supine and the left groin was prepped  and draped to be included in a sterile operative field.  The old scar  cicatrix was excised in an elliptical incision and discarded.  The  dissection was carried down through the subcutaneous tissues to the external  oblique aponeurosis.  It was a rather tedious dissection to open the  external oblique aponeurosis and to expose the underlying spermatic cord,  which was dissected free from Hesselbach's triangle, thus exposing the  hernial defect.  The previous repair was not done with any mesh that was  currently visible.  The defect was then patched with a polypropylene plug  and then over the plug the entire aspect of Hesselbach's triangle was  buttressed with a large mesh patch going up the pubic tubercle, carried up  along the conjoined tendon to the  internal ring, then again from the pubic  tubercle along the shelving edge of Poupart's ligament to the internal ring.  The repair was noted to be stable and firm.   Sponge, instrument and sharp counts were verified.   The spermatic cord returned to its anatomic position and the external  oblique aponeurosis was closed over the spermatic cord after second  verification of the sponge and instrument counts.  Subcutaneous tissues were  closed with a running 3-0 Vicryl suture and the skin closed with a running 4-  0 Monocryl suture, and then reinforced with Steri-strips.  Sterile dressings  were applied.   Anesthetic reversed.  The patient was removed from the operating room to the  recovery room in stable condition.  He tolerated the procedure well.  Leonie Man, MD    PB/MEDQ  D:  05/10/2002  T:  05/10/2002  Job:  [id number]

## 2011-03-31 ENCOUNTER — Other Ambulatory Visit: Payer: Self-pay | Admitting: Family Medicine

## 2011-03-31 NOTE — Telephone Encounter (Signed)
Refill request

## 2011-04-01 ENCOUNTER — Encounter: Payer: Self-pay | Admitting: Family Medicine

## 2011-04-01 ENCOUNTER — Ambulatory Visit (INDEPENDENT_AMBULATORY_CARE_PROVIDER_SITE_OTHER): Payer: BC Managed Care – PPO | Admitting: Family Medicine

## 2011-04-01 VITALS — BP 128/82 | HR 76 | Temp 99.3°F | Ht 68.5 in | Wt 158.0 lb

## 2011-04-01 DIAGNOSIS — R05 Cough: Secondary | ICD-10-CM

## 2011-04-01 DIAGNOSIS — R053 Chronic cough: Secondary | ICD-10-CM | POA: Insufficient documentation

## 2011-04-01 DIAGNOSIS — R059 Cough, unspecified: Secondary | ICD-10-CM

## 2011-04-01 MED ORDER — AMOXICILLIN-POT CLAVULANATE 875-125 MG PO TABS: 1.0000 | ORAL_TABLET | Freq: Two times a day (BID) | ORAL | Status: AC

## 2011-04-01 NOTE — Assessment & Plan Note (Signed)
Symptoms most likely chronic sinusitis related to allergies vs bacterial source. Less likely ACEi-induced (patient has been on med for >59yr), or GERD related or pulmonary etiology (no lung exam findings or risk factor for pna). Will treat sinusitis empirically with scheduled cetirizine x 2 weeks and augmentin x 7 days. Will f/u if not resolved in next 2 weeks. Would consider chest x-ray and trial off lisinopril or PPI trial if not improved by then.

## 2011-04-01 NOTE — Patient Instructions (Signed)
Nice to meet you. Take augmentin for next 7 days. Take a daily antihistamine (zyrtec or equivalent) for inflammation. If cough persists after 2 weeks, please call for appointment.

## 2011-04-01 NOTE — Progress Notes (Signed)
  Subjective:    Patient ID: Jose Shea, male    DOB: September 16, 1955, 55 y.o.   MRN: [id number]  HPI  1. Cough. First began in late July ~6-7 weeks ago with a sinus headache, watery eyes and congestion. His congestion improved but cough has continued to linger for several weeks. Now has developed hoarseness. Intermittently productive of yellow sputum. Did have sinus pressure and headache for several hours recently. Having body aches and fatigue. Tried sudafed, mucinex, theraflu without much benefit.   Denies daily heartburn, hemoptysis, wheezing, chest pain, dyspnea, fevers.  Has history of seasonal allergies especially bad this year. Denies smoking.   Review of Systems See HPI otherwise negative.    Objective:   Physical Exam  Vitals reviewed. Constitutional: He is oriented to person, place, and time. He appears well-developed and well-nourished. No distress.       Voice is hoarse.  HENT:  Head: Normocephalic and atraumatic.  Right Ear: External ear normal.  Left Ear: External ear normal.  Nose: Nose normal.       Posterior pharynx has erythema and injection. No lesions or exudates.   Minimal TTP over frontal sinus. No maxillary sinus tenderness.  Eyes: Conjunctivae and EOM are normal. Pupils are equal, round, and reactive to light. Right eye exhibits no discharge. Left eye exhibits no discharge.  Neck: Normal range of motion. No thyromegaly present.       Single <0.5cm ant cervical LAD.  Cardiovascular: Normal rate, regular rhythm and normal heart sounds.   No murmur heard. Pulmonary/Chest: Effort normal and breath sounds normal. No respiratory distress. He has no wheezes. He has no rales. He exhibits no tenderness.  Musculoskeletal: Normal range of motion. He exhibits no edema and no tenderness.  Neurological: He is alert and oriented to person, place, and time.          Assessment & Plan:

## 2011-04-08 ENCOUNTER — Other Ambulatory Visit: Payer: Self-pay | Admitting: Family Medicine

## 2011-04-09 NOTE — Telephone Encounter (Signed)
Refill request

## 2011-06-01 ENCOUNTER — Ambulatory Visit (INDEPENDENT_AMBULATORY_CARE_PROVIDER_SITE_OTHER): Payer: BC Managed Care – PPO

## 2011-06-01 DIAGNOSIS — Z23 Encounter for immunization: Secondary | ICD-10-CM

## 2011-07-08 ENCOUNTER — Other Ambulatory Visit: Payer: Self-pay | Admitting: Family Medicine

## 2011-07-08 NOTE — Telephone Encounter (Signed)
Refill request

## 2011-09-06 ENCOUNTER — Other Ambulatory Visit: Payer: Self-pay | Admitting: Family Medicine

## 2011-09-06 NOTE — Telephone Encounter (Signed)
Refill request

## 2011-09-28 ENCOUNTER — Other Ambulatory Visit: Payer: Self-pay | Admitting: Family Medicine

## 2011-09-28 NOTE — Telephone Encounter (Signed)
Refill request

## 2012-02-17 ENCOUNTER — Other Ambulatory Visit: Payer: Self-pay | Admitting: Family Medicine

## 2012-02-17 MED ORDER — NADOLOL 80 MG PO TABS: ORAL_TABLET | ORAL | Status: DC

## 2012-02-29 ENCOUNTER — Other Ambulatory Visit: Payer: Self-pay | Admitting: *Deleted

## 2012-02-29 MED ORDER — NARATRIPTAN HCL 2.5 MG PO TABS: ORAL_TABLET | ORAL | Status: DC

## 2012-06-03 ENCOUNTER — Other Ambulatory Visit: Payer: Self-pay | Admitting: *Deleted

## 2012-06-03 MED ORDER — DOXEPIN HCL 50 MG PO CAPS: 50.0000 mg | ORAL_CAPSULE | Freq: Every day | ORAL | Status: DC

## 2012-11-08 ENCOUNTER — Other Ambulatory Visit: Payer: Self-pay | Admitting: *Deleted

## 2012-11-08 MED ORDER — FLUTICASONE PROPIONATE 50 MCG/ACT NA SUSP: NASAL | Status: DC

## 2012-11-10 ENCOUNTER — Other Ambulatory Visit: Payer: Self-pay | Admitting: *Deleted

## 2012-11-12 MED ORDER — ROPINIROLE HCL 0.25 MG PO TABS: ORAL_TABLET | ORAL | Status: DC

## 2012-11-12 MED ORDER — METHOCARBAMOL 500 MG PO TABS: ORAL_TABLET | ORAL | Status: DC

## 2012-11-12 MED ORDER — LISINOPRIL 10 MG PO TABS: ORAL_TABLET | ORAL | Status: DC

## 2013-02-23 ENCOUNTER — Other Ambulatory Visit: Payer: Self-pay | Admitting: Family Medicine

## 2013-04-28 ENCOUNTER — Ambulatory Visit (INDEPENDENT_AMBULATORY_CARE_PROVIDER_SITE_OTHER): Payer: BC Managed Care – PPO | Admitting: Family Medicine

## 2013-04-28 ENCOUNTER — Encounter: Payer: Self-pay | Admitting: Family Medicine

## 2013-04-28 VITALS — BP 121/81 | HR 58 | Temp 98.7°F | Ht 68.0 in | Wt 162.5 lb

## 2013-04-28 DIAGNOSIS — M542 Cervicalgia: Secondary | ICD-10-CM

## 2013-04-28 DIAGNOSIS — G43909 Migraine, unspecified, not intractable, without status migrainosus: Secondary | ICD-10-CM

## 2013-04-28 DIAGNOSIS — I10 Essential (primary) hypertension: Secondary | ICD-10-CM

## 2013-04-28 DIAGNOSIS — Z23 Encounter for immunization: Secondary | ICD-10-CM

## 2013-04-28 MED ORDER — NARATRIPTAN HCL 2.5 MG PO TABS: ORAL_TABLET | ORAL | Status: DC

## 2013-04-28 MED ORDER — METHOCARBAMOL 500 MG PO TABS: ORAL_TABLET | ORAL | Status: DC

## 2013-04-28 MED ORDER — NADOLOL 80 MG PO TABS: ORAL_TABLET | ORAL | Status: DC

## 2013-04-28 MED ORDER — LISINOPRIL 10 MG PO TABS: ORAL_TABLET | ORAL | Status: DC

## 2013-04-28 MED ORDER — ROPINIROLE HCL 0.25 MG PO TABS: ORAL_TABLET | ORAL | Status: DC

## 2013-04-28 MED ORDER — FLUTICASONE PROPIONATE 50 MCG/ACT NA SUSP: NASAL | Status: DC

## 2013-04-28 MED ORDER — DOXEPIN HCL 50 MG PO CAPS: 50.0000 mg | ORAL_CAPSULE | Freq: Every day | ORAL | Status: DC

## 2013-04-28 NOTE — Progress Notes (Signed)
Subjective:     Patient ID: Jose Shea, male   DOB: Oct 28, 1955, 57 y.o.   MRN: [id number]  HPI 57 year old male with HTN, allergic rhinitis, and migraines presents for follow up.  1) HTN Disease Monitoring: Home BP Monitoring - yes Chest pain- no    Dyspnea- no Medications:Lisinopril; Nadolol for migraine PPx Compliance-  yes Lightheadedness-  no  Edema- no   2) Migraines - Patient reports that his migraines are well controlled on his current regimen (nadalol and doxepin for PPx; naratriptan for abortive therapy).  - No increase in frequency or severity.  3) Neck pain - Patient reports a longstanding history of neck pain. - He states that it is intermittent.  He states the pain is moderate in severity when it occurs. - Prior history of "bulging discs" - Pain improves with PRN Robaxin. - No associated numbness/tingling or weakness in upper extremities.  History  Substance Use Topics  . Smoking status: Never Smoker   . Smokeless tobacco: Not on file  . Alcohol Use: Not on file    Review of Systems Per HPI    Objective:   Physical Exam Exam: General: well appearing middle aged gentlemen; NAD. Neck: Normal ROM.  Negative Spurling's test. Cardiovascular: RRR. No murmurs, rubs, or gallops. Respiratory: CTAB. No rales, rhonchi, or wheeze. Abdomen: soft, nontender, nondistended. Extremities: No LE edema.     Assessment:     See Problem List    Plan:

## 2013-04-28 NOTE — Assessment & Plan Note (Signed)
At goal. Lisinopril refilled today. Obtaining CMP as patient has not had labs in over a year.

## 2013-04-28 NOTE — Assessment & Plan Note (Signed)
Well controlled. Medications refilled today.

## 2013-04-28 NOTE — Assessment & Plan Note (Signed)
Normal physical exam. Likely MSK in nature. Patient reports that PT has worked well in the past. Will place referral to PT.

## 2013-04-28 NOTE — Patient Instructions (Addendum)
It was nice seeing you today.  You're doing great.  Continue to take your medications as prescribed.  Please return to get your labs drawn (fasting - do not eat after midnight).

## 2013-05-01 ENCOUNTER — Other Ambulatory Visit: Payer: BC Managed Care – PPO

## 2013-05-01 DIAGNOSIS — I10 Essential (primary) hypertension: Secondary | ICD-10-CM

## 2013-05-01 LAB — LIPID PANEL
HDL: 33 mg/dL — ABNORMAL LOW (ref >39)
LDL Cholesterol: 114 mg/dL — ABNORMAL HIGH (ref 0–99)
Triglycerides: 195 mg/dL — ABNORMAL HIGH (ref <150)
VLDL: 39 mg/dL (ref 0–40)

## 2013-05-01 LAB — COMPLETE METABOLIC PANEL WITH GFR
ALT: 27 U/L (ref 0–53)
AST: 22 U/L (ref 0–37)
Albumin: 3.7 g/dL (ref 3.5–5.2)
Alkaline Phosphatase: 47 U/L (ref 39–117)
Calcium: 8.5 mg/dL (ref 8.4–10.5)
Chloride: 105 mEq/L (ref 96–112)
Creat: 0.92 mg/dL (ref 0.50–1.35)
Potassium: 4 mEq/L (ref 3.5–5.3)
Sodium: 142 mEq/L (ref 135–145)
Total Protein: 5.9 g/dL — ABNORMAL LOW (ref 6.0–8.3)

## 2013-05-01 NOTE — Progress Notes (Signed)
CMP AND FLP DONE TODAY Jose Shea 

## 2013-05-04 ENCOUNTER — Encounter: Payer: Self-pay | Admitting: Family Medicine

## 2013-05-15 ENCOUNTER — Ambulatory Visit: Payer: BC Managed Care – PPO | Attending: Family Medicine | Admitting: Physical Therapy

## 2013-05-15 DIAGNOSIS — M25519 Pain in unspecified shoulder: Secondary | ICD-10-CM | POA: Insufficient documentation

## 2013-05-15 DIAGNOSIS — M542 Cervicalgia: Secondary | ICD-10-CM | POA: Insufficient documentation

## 2013-05-15 DIAGNOSIS — IMO0001 Reserved for inherently not codable concepts without codable children: Secondary | ICD-10-CM | POA: Insufficient documentation

## 2013-05-19 ENCOUNTER — Ambulatory Visit (INDEPENDENT_AMBULATORY_CARE_PROVIDER_SITE_OTHER): Payer: BC Managed Care – PPO | Admitting: Family Medicine

## 2013-05-19 ENCOUNTER — Encounter: Payer: Self-pay | Admitting: Family Medicine

## 2013-05-19 VITALS — BP 115/89 | HR 55 | Temp 98.9°F | Ht 68.5 in | Wt 160.0 lb

## 2013-05-19 DIAGNOSIS — E785 Hyperlipidemia, unspecified: Secondary | ICD-10-CM

## 2013-05-19 DIAGNOSIS — I10 Essential (primary) hypertension: Secondary | ICD-10-CM

## 2013-05-19 NOTE — Progress Notes (Signed)
Subjective:     Patient ID: Jose Shea, male   DOB: 1955/10/20, 57 y.o.   MRN: [id number]  HPI 57 year old male with HTN, Neck pain, and migraine presents for follow up.  Patient would like to discuss labs today.  1) HTN  - Well controlled on Lisinopril - No SOB, chest pain, LE edema, dizziness/lightheadedness  2) Recent labs - Lipid panel obtained on 10/6. Results are below: Lipid Panel     Component Value Date/Time   CHOL 186 05/01/2013 0914   TRIG 195* 05/01/2013 0914   HDL 33* 05/01/2013 0914   CHOLHDL 5.6 05/01/2013 0914   VLDL 39 05/01/2013 0914   LDLCALC 114* 05/01/2013 0914   - Patient concerned about elevated cholesterol.  He would like to discuss treatment options today.  Review of Systems Per HPI    Objective:   Physical Exam Exam: General: well appearing male in NAD. Cardiovascular: RRR. No murmurs, rubs, or gallops. Respiratory: CTAB. No rales, rhonchi, or wheeze. Abdomen: soft, nontender, nondistended. Extremities:  No LE edema.    Assessment:     See Problem List    Plan:

## 2013-05-20 DIAGNOSIS — E782 Mixed hyperlipidemia: Secondary | ICD-10-CM | POA: Insufficient documentation

## 2013-05-20 NOTE — Assessment & Plan Note (Signed)
Well controlled. Continue Lisinopril. 

## 2013-05-20 NOTE — Assessment & Plan Note (Signed)
ASCVD 10 year risk - 7.2%. After long discussion regarding treatment, patient elected to pursue Therapeutic Lifestyle changes.  Will re-visit in ~ 6 months.  Will consider statin again at that time.

## 2013-05-22 ENCOUNTER — Ambulatory Visit: Payer: BC Managed Care – PPO | Admitting: Physical Therapy

## 2013-05-24 ENCOUNTER — Ambulatory Visit: Payer: BC Managed Care – PPO | Admitting: Physical Therapy

## 2013-05-29 ENCOUNTER — Ambulatory Visit: Payer: BC Managed Care – PPO | Attending: Family Medicine | Admitting: Physical Therapy

## 2013-05-29 DIAGNOSIS — M542 Cervicalgia: Secondary | ICD-10-CM | POA: Insufficient documentation

## 2013-05-29 DIAGNOSIS — IMO0001 Reserved for inherently not codable concepts without codable children: Secondary | ICD-10-CM | POA: Insufficient documentation

## 2013-05-29 DIAGNOSIS — M25519 Pain in unspecified shoulder: Secondary | ICD-10-CM | POA: Insufficient documentation

## 2013-05-31 ENCOUNTER — Ambulatory Visit: Payer: BC Managed Care – PPO | Admitting: Physical Therapy

## 2013-06-05 ENCOUNTER — Ambulatory Visit: Payer: BC Managed Care – PPO | Admitting: Physical Therapy

## 2013-06-07 ENCOUNTER — Ambulatory Visit: Payer: BC Managed Care – PPO | Admitting: Physical Therapy

## 2013-06-12 ENCOUNTER — Ambulatory Visit: Payer: BC Managed Care – PPO | Admitting: Physical Therapy

## 2013-06-14 ENCOUNTER — Ambulatory Visit: Payer: BC Managed Care – PPO | Admitting: Physical Therapy

## 2014-01-24 ENCOUNTER — Ambulatory Visit (INDEPENDENT_AMBULATORY_CARE_PROVIDER_SITE_OTHER): Payer: BC Managed Care – PPO | Admitting: Family Medicine

## 2014-01-24 VITALS — BP 129/86 | HR 53 | Temp 98.6°F | Ht 68.0 in | Wt 149.0 lb

## 2014-01-24 DIAGNOSIS — I10 Essential (primary) hypertension: Secondary | ICD-10-CM

## 2014-01-24 DIAGNOSIS — E785 Hyperlipidemia, unspecified: Secondary | ICD-10-CM

## 2014-01-24 DIAGNOSIS — M160 Bilateral primary osteoarthritis of hip: Secondary | ICD-10-CM | POA: Insufficient documentation

## 2014-01-24 DIAGNOSIS — Z1159 Encounter for screening for other viral diseases: Secondary | ICD-10-CM

## 2014-01-24 DIAGNOSIS — M25559 Pain in unspecified hip: Secondary | ICD-10-CM

## 2014-01-24 DIAGNOSIS — M25551 Pain in right hip: Secondary | ICD-10-CM

## 2014-01-24 NOTE — Patient Instructions (Signed)
It was nice to see you today.  Continue taking your medications are prescribed.  Follow up labs in October to check your cholesterol.   Get the xray at your convenience.

## 2014-01-24 NOTE — Assessment & Plan Note (Signed)
Appears MSK in nature. Obtaining xray today.

## 2014-01-24 NOTE — Assessment & Plan Note (Signed)
After 5 before office visit was complete. Will obtain in October with repeat lipid panel.

## 2014-01-24 NOTE — Assessment & Plan Note (Signed)
Well controlled.  Continue currently therapy with Lisinopril

## 2014-01-24 NOTE — Progress Notes (Signed)
   Subjective:    Patient ID: Jose Shea, male    DOB: 1956-01-08, 58 y.o.   MRN: [id number]  HPI 58 year old male with HLD and HTN presents for follow up.  1) HTN Disease Monitoring: Home BP Monitoring - yes.  Medications:Lisinopril Compliance -  Yes ROS: Denies chest pain, SOB, lightheadedness/dizziness   2) HCV screening - Patient heard about screening for those in age bracket.  He desires screening. - No risk factors for HCV: No IV drug use, risky sexual practices, tattoos, etc.  3) Right hip pain - Patient has been recently experiencing right hip pain  - Pain is moderate in severity and worse with certain positions while lying down. - Pain is located over the iliac crest.   - No relieving factors; although it does wax and wane and is currently doing well. - No reports of groin pain.  No pain over greater trochanter.  Review of Systems Per HPI    Objective:   Physical Exam Filed Vitals:   01/24/14 1634  BP: 129/86  Pulse: 53  Temp: 98.6 F (37 C)   Exam: General: well appearing, NAD. Cardiovascular: RRR. No murmurs, rubs, or gallops. Respiratory: CTAB. No rales, rhonchi, or wheeze. MSK - Hip: ROM - Full ROM. Pelvic alignment unremarkable to inspection and palpation. Greater trochanter without tenderness to palpation. No pain with FABER or FADIR. No SI joint tenderness and normal minimal SI movement.     Assessment & Plan:  See Problem List

## 2014-02-14 ENCOUNTER — Other Ambulatory Visit: Payer: Self-pay | Admitting: Family Medicine

## 2014-02-14 ENCOUNTER — Ambulatory Visit
Admission: RE | Admit: 2014-02-14 | Discharge: 2014-02-14 | Disposition: A | Discharge status: Home / Self Care | Payer: BC Managed Care – PPO | Source: Ambulatory Visit | Attending: Family Medicine | Admitting: Family Medicine

## 2014-02-14 DIAGNOSIS — M25559 Pain in unspecified hip: Secondary | ICD-10-CM

## 2014-02-14 DIAGNOSIS — M25551 Pain in right hip: Secondary | ICD-10-CM

## 2014-02-21 ENCOUNTER — Telehealth: Payer: Self-pay | Admitting: Family Medicine

## 2014-02-21 NOTE — Telephone Encounter (Signed)
Please inform patient

## 2014-02-21 NOTE — Telephone Encounter (Signed)
Mild degenerative changes of the hip.

## 2014-02-21 NOTE — Telephone Encounter (Signed)
Pt called and wanted to know if the results are back from the x-rays he had taken. jw

## 2014-04-26 ENCOUNTER — Telehealth: Payer: Self-pay | Admitting: Family Medicine

## 2014-04-26 NOTE — Telephone Encounter (Signed)
[employer] has identified pt with chronic health problems. Employer is offering health and wellness program. When the pt comes in for an appt, please tell pt [employer] program

## 2014-05-09 ENCOUNTER — Telehealth: Payer: Self-pay | Admitting: Family Medicine

## 2014-05-09 MED ORDER — BENZONATATE 100 MG PO CAPS: 100.0000 mg | ORAL_CAPSULE | Freq: Three times a day (TID) | ORAL | Status: DC | PRN

## 2014-05-09 NOTE — Telephone Encounter (Signed)
Pt called and would like some cough syrup called in. He is getting over his cold but still has a cough. Please call him if he needs to make an appointment or when the medication is called in. jw

## 2014-05-09 NOTE — Telephone Encounter (Signed)
I called in Tessalon (pill) for his cough.

## 2014-05-14 ENCOUNTER — Encounter: Payer: Self-pay | Admitting: Family Medicine

## 2014-05-14 ENCOUNTER — Ambulatory Visit (INDEPENDENT_AMBULATORY_CARE_PROVIDER_SITE_OTHER): Payer: BC Managed Care – PPO | Admitting: Family Medicine

## 2014-05-14 VITALS — BP 141/98 | HR 64 | Temp 98.9°F | Ht 68.0 in | Wt 145.0 lb

## 2014-05-14 DIAGNOSIS — J069 Acute upper respiratory infection, unspecified: Secondary | ICD-10-CM

## 2014-05-14 DIAGNOSIS — J189 Pneumonia, unspecified organism: Secondary | ICD-10-CM | POA: Insufficient documentation

## 2014-05-14 MED ORDER — GUAIFENESIN-CODEINE 100-10 MG/5ML PO SOLN: 10.0000 mL | Freq: Four times a day (QID) | ORAL | Status: DC | PRN

## 2014-05-14 NOTE — Assessment & Plan Note (Addendum)
Patient with symptoms of viral URI, though given body aches, fever, and sweats possibly had the flu. At this time the patient is improving and persistent cough is his main concern. No pulmonary abnormalities on exam. Well appearing with intermittent cough on exam. Will treat cough with codeine cough syrup. Given return precautions. F/u prn.

## 2014-05-14 NOTE — Progress Notes (Signed)
Patient ID: [name], male   DOB: [birth year], 58 y.o.   MRN: [id number]  Tommi Rumps, MD Phone: [phone]  Jose Shea is a 58 y.o. male who presents today for same day appointment.   Cough: patient reports 2 weeks ago started with congestion, post nasal drip, then developed clear productive cough. Noted feeling feverish initially with body aches and sweats, though did not check his temperature. He notes decreased appetite with this. No ear fullness. He reports over all he is feeling better, though continues to feel weak. Appetite is returning, congestion is gone, no body aches or sweats in over a week. Cough just remains. He tried nyquil, advil sinus, alka seltzer, airborne, mucinex, and tylenol for this. He also tried tessalon with little benefit.   Patient is a nonsmoker.   ROS: Per HPI   Physical Exam Filed Vitals:   05/14/14 0916  BP: 141/98  Pulse: 64  Temp: 98.9 F (37.2 C)    Gen: Well NAD HEENT: PERRL,  MMM, bilateral TMs normal, no OP erythema, no cervical LAD Lungs: CTABL Nl WOB Heart: RRR no MRG Exts: Non edematous BL  LE, warm and well perfused.    Assessment/Plan: Please see individual problem list.   Tommi Rumps, MD [place] PGY-3

## 2014-05-14 NOTE — Patient Instructions (Addendum)
Nice to meet you. If you develop any fever, worsening appetite, cough, shortness or breath, or do not continue to feel improved by the end of the week please let us know.  Please drink plenty of fluids and start back to a bland diet.

## 2014-05-16 ENCOUNTER — Telehealth: Payer: Self-pay | Admitting: Family Medicine

## 2014-05-16 NOTE — Telephone Encounter (Signed)
Will forward to Md to see if he is ok with this extension. Maridee Slape,CMA

## 2014-05-16 NOTE — Telephone Encounter (Signed)
I am fine extending his being off of work through the weekend, though if he is unable to return to work by Monday he will need to come in to be re-evaluated. He should be improving by that time.

## 2014-05-16 NOTE — Telephone Encounter (Signed)
Patient is requesting an extension to his work note. Was seen on Monday by Caryl Bis, and taken out until tomorrow. Pt states he is still not feeling 100% and requesting an extension until Tuesday 05/22/14. Pls advise.

## 2014-05-16 NOTE — Telephone Encounter (Signed)
Pt informed, letter printed and placed up front. Jose Shea, Jose Shea

## 2014-05-21 ENCOUNTER — Ambulatory Visit
Admission: RE | Admit: 2014-05-21 | Discharge: 2014-05-21 | Disposition: A | Discharge status: Home / Self Care | Payer: BC Managed Care – PPO | Source: Ambulatory Visit | Attending: Family Medicine | Admitting: Family Medicine

## 2014-05-21 ENCOUNTER — Encounter: Payer: Self-pay | Admitting: Family Medicine

## 2014-05-21 ENCOUNTER — Other Ambulatory Visit: Payer: Self-pay | Admitting: Family Medicine

## 2014-05-21 ENCOUNTER — Ambulatory Visit (INDEPENDENT_AMBULATORY_CARE_PROVIDER_SITE_OTHER): Payer: BC Managed Care – PPO | Admitting: Family Medicine

## 2014-05-21 ENCOUNTER — Telehealth: Payer: Self-pay | Admitting: *Deleted

## 2014-05-21 VITALS — BP 135/83 | HR 60 | Temp 98.2°F | Ht 68.0 in | Wt 143.0 lb

## 2014-05-21 DIAGNOSIS — R05 Cough: Secondary | ICD-10-CM | POA: Diagnosis not present

## 2014-05-21 DIAGNOSIS — J189 Pneumonia, unspecified organism: Secondary | ICD-10-CM

## 2014-05-21 DIAGNOSIS — R059 Cough, unspecified: Secondary | ICD-10-CM

## 2014-05-21 MED ORDER — DOXYCYCLINE HYCLATE 100 MG PO TABS: 100.0000 mg | ORAL_TABLET | Freq: Two times a day (BID) | ORAL | Status: DC

## 2014-05-21 NOTE — Telephone Encounter (Signed)
[facility] call to report results of chest x-ray completed this AM; Primarily interstitial infiltrate throughout much of the lingula. Results are in EPIC.  Derl Barrow, RN

## 2014-05-21 NOTE — Patient Instructions (Signed)
Nice to see you. You likely have a pneumonia. We will treat you with doxycycline.  Please go get the x-ray. If you develop shortness of breath, changing chest pain, cough blood up please go to the ED.

## 2014-05-22 NOTE — Telephone Encounter (Signed)
Forward to [place].

## 2014-05-22 NOTE — Assessment & Plan Note (Signed)
Patient with continued cough and now with lung sounds consistent with PNA. CXR revealed linugular infiltrate. Patient will take a 7 day course of doxycycline given potential for interaction between his doxepin and azithromycin. To return if not improving on this medication. Given return precautions.

## 2014-05-22 NOTE — Progress Notes (Signed)
Patient ID: Jose Shea, male   DOB: 1956-06-15, 58 y.o.   MRN: [id number]  Tommi Rumps, MD Phone: [phone]  Jose Shea is a 58 y.o. male who presents today for same day appointment.  Cough: Seen 1 week ago for 2 weeks of coughing. Seemed to be improving overall at that time. Now patient with continued cough now productive of green sputum. Has noted sharp pain in right ribs and back with this cough. Pain only occurs when coughing. Not an exertional pain. No radiation of the pain. Mild shortness of breath with coughing, none otherwise. Notes sweating in the middle of the night 2-3x since last being seen. Denies fevers. He tried a variety of medication to help with this though none have proven beneficial.   Patient is a nonsmoker.   ROS: Per HPI   Physical Exam Filed Vitals:   05/21/14 1015  BP: 135/83  Pulse: 60  Temp: 98.2 F (36.8 C)    Gen: NAD HEENT: PERRL,  MMM, bilateral TMs normal, no OP erythema Lungs: left lower lung field with rales, no wheezes Nl WOB Heart: RRR no MRG Exts: Non edematous BL  LE, warm and well perfused.    Assessment/Plan: Please see individual problem list.  Tommi Rumps, MD [place] PGY-3

## 2014-05-25 ENCOUNTER — Telehealth: Payer: Self-pay | Admitting: Family Medicine

## 2014-05-25 NOTE — Telephone Encounter (Signed)
Will forward to MD to see if he will ok this letter. Georg Ang,CMA

## 2014-05-25 NOTE — Telephone Encounter (Signed)
Pt called and would like the letter that was written for his employer to be out of work extended until later next week. Please call when this is up front. He has been seen by Dr. Caryl Bis. jw

## 2014-05-27 ENCOUNTER — Ambulatory Visit (INDEPENDENT_AMBULATORY_CARE_PROVIDER_SITE_OTHER): Payer: BC Managed Care – PPO | Admitting: Family Medicine

## 2014-05-27 VITALS — BP 126/78 | HR 59 | Temp 98.5°F | Resp 18 | Ht 69.0 in | Wt 147.0 lb

## 2014-05-27 DIAGNOSIS — S61502A Unspecified open wound of left wrist, initial encounter: Secondary | ICD-10-CM

## 2014-05-27 DIAGNOSIS — S61512A Laceration without foreign body of left wrist, initial encounter: Secondary | ICD-10-CM

## 2014-05-27 DIAGNOSIS — Z23 Encounter for immunization: Secondary | ICD-10-CM

## 2014-05-27 NOTE — Patient Instructions (Signed)

## 2014-05-27 NOTE — Progress Notes (Signed)
   Subjective:    Patient ID: Jose Shea, male    DOB: [birth year], 58 y.o.   MRN: [id number] This chart was scribed for Reginia Forts, MD by Marti Sleigh, Medical Scribe. This patient was seen in Room 7 and the patient's care was started at 2:13 PM.  HPI HPI Comments: Jose Shea is a 58 y.o. male who presents to [facility] complaining of a laceration to his left wrist that happened this morning. Pt reported to [facility] because his laceration failed to stop bleeding. Pt denies numbness or tingling. Full ROM of wrist without weakness. Was reaching into mailbox that had been vandelized on Halloween with cut to wrist.   Last Tetanus more than 5 years ago.   Review of Systems  Musculoskeletal: Negative for myalgias and arthralgias.  Skin: Positive for wound.  Neurological: Negative for weakness and numbness.   Past Medical History  Diagnosis Date  . Restless leg   . Hypertension   . Chronic headaches    Past Surgical History  Procedure Laterality Date  . Hernia repair     Allergies  Allergen Reactions  . Fluoxetine Hcl   . Quinine         Objective:   Physical Exam  Constitutional: He is oriented to person, place, and time. He appears well-developed and well-nourished.  HENT:  Head: Normocephalic and atraumatic.  Eyes: Pupils are equal, round, and reactive to light.  Neck: No JVD present.  Cardiovascular: Normal rate and regular rhythm.   Capillary refill intact.  Pulmonary/Chest: Effort normal and breath sounds normal. No respiratory distress.  Musculoskeletal:       Left wrist: He exhibits laceration. He exhibits normal range of motion, no tenderness and no bony tenderness.  Neurological: He is alert and oriented to person, place, and time. He has normal strength. No sensory deficit.  Skin: Skin is warm and dry.  6x65mm laceration along the left wrist with good approximation. Full ROM in wrist.  Psychiatric: He has a normal mood and affect. His behavior is normal.  Nursing  note and vitals reviewed.  TDAP ADMINISTERED.     Assessment & Plan:  Wound, open, wrist, left, initial encounter  Need for diphtheria-tetanus-pertussis (Tdap) vaccine - Plan: Tdap vaccine greater than or equal to 7yo IM    1. Wound/laceration L wrist: New.  S/p TDAP. S/p suture repair. RTC 7-10 days for suture removal. Local wound care reviewed in detail. RTC for fever, redness, drainage, or pain. 2.  S/p TDAP.    No orders of the defined types were placed in this encounter.   I personally performed the services described in this documentation, which was scribed in my presence.  The recorded information has been reviewed and is accurate.  Reginia Forts, M.D.  [facility] [address] [place], [place]  [address] [phone] phone [phone] fax

## 2014-05-27 NOTE — Progress Notes (Signed)
Verbal consent obtained from patient.  Local anesthesia with 3cc Lidocaine 1% without epinephrine.  Wound scrubbed with soap and water and rinsed.  Wound closed with #2 4-0 Prolene (#1 HM, #! SI) sutures.  Wound cleansed and dressed.

## 2014-05-28 NOTE — Telephone Encounter (Signed)
I will write a letter keeping him out until 11/4. He will be at the end of his antibiotic course at that time. If he is not feeling up to returning to work at that time he will need to be seen for follow-up in clinic.

## 2014-05-28 NOTE — Telephone Encounter (Signed)
Pt calls again this AM.  He sis not go to work this AM as he is still having some back pain and not feeling well.  Pt states that he also cut his hand this weekend and had to get 2 stiches and a tetanus shot. Fleeger, Salome Spotted

## 2014-05-28 NOTE — Telephone Encounter (Signed)
Pt is agreeable to going back to work on 05/31/2014.  Letter up front for pickup. Lyan Moyano, Salome Spotted

## 2014-06-04 ENCOUNTER — Telehealth: Payer: Self-pay | Admitting: Family Medicine

## 2014-06-04 NOTE — Telephone Encounter (Signed)
Pt dropped off fmla forms to be completed, pt was seen by Dr. Caryl Bis on 05/21/14. Given to [facility] team for clinical completion

## 2014-06-05 ENCOUNTER — Ambulatory Visit (INDEPENDENT_AMBULATORY_CARE_PROVIDER_SITE_OTHER): Payer: BC Managed Care – PPO | Admitting: Family Medicine

## 2014-06-05 VITALS — BP 132/80 | HR 61 | Temp 98.6°F | Resp 17 | Ht 67.5 in | Wt 146.0 lb

## 2014-06-05 DIAGNOSIS — S61502D Unspecified open wound of left wrist, subsequent encounter: Secondary | ICD-10-CM

## 2014-06-05 NOTE — Progress Notes (Signed)
    IDENTIFYING INFORMATION  Jose Shea / DOB: 1956-03-24 / MRN: [id number]  The patient has Migraine; HYPERTENSION, BENIGN SYSTEMIC; RHINITIS, ALLERGIC; History of gastric ulcer; Neck pain; HLD (hyperlipidemia); Need for hepatitis C screening test; Right hip pain; and Community acquired pneumonia on his problem list.  SUBJECTIVE  Chief Complaint: Suture / Staple Removal   History of present illness: [name] is a 58 y.o. year old male who presents for removal of two sutures on the medial/palmar aspect of the left wrist.  He is asymptomatic today, and denies purulence, pain, and swelling at the site of the repair.   He received a Tdap on 05/27/2014.  He  has a past medical history of Restless leg; Hypertension; and Chronic headaches.  The patient has a current medication list which includes the following prescription(s): aspirin, doxepin, doxycycline, fluticasone, guaifenesin-codeine, lisinopril, methocarbamol, nadolol, naratriptan, and ropinirole.  [name] is allergic to fluoxetine hcl and quinine. He  reports that he has never smoked. He does not have any smokeless tobacco history on file. He  has no sexual activity history on file.  The patient  has past surgical history that includes Hernia repair.  His family history is not on file.  Review of Systems  Constitutional: Negative.   Musculoskeletal: Negative for myalgias.  Skin: Negative for itching and rash.    OBJECTIVE  Blood pressure 132/80, pulse 61, temperature 98.6 F (37 C), temperature source Oral, resp. rate 17, height 5' 7.5" (1.715 m), weight 146 lb (66.225 kg), SpO2 95 %. The patient's body mass index is 22.52 kg/(m^2).  Physical Exam  Constitutional: He is well-developed, well-nourished, and in no distress.  Skin: Skin is warm and dry.      Procedure: Verbal consent obtained. Two sutures removed. The patient tolerated the procedure without difficulty.     No results found for this or any previous  visit (from the past 24 hour(s)).  ASSESSMENT & PLAN  Jose Shea was seen today for suture / staple removal.  Diagnoses and associated orders for this visit:  Wound, open, wrist, left, subsequent encounter -     Pt advised to RTC should he begin to have problems.     The patient was instructed to to call or comeback to clinic as needed, or should symptoms warrant.  Philis Fendt, MHS, PA-C [facility] 06/05/2014 5:34 PM

## 2014-06-05 NOTE — Telephone Encounter (Signed)
Placed in Dr. Tharon Aquas box. Fleeger, Salome Spotted

## 2014-06-05 NOTE — Progress Notes (Signed)
Examined the wound and agree that it looks a little inflamed, however with the sutures out I think it will go ahead and resolved. Patient was instructed to return if worse. Discussed with the PA.

## 2014-06-06 NOTE — Telephone Encounter (Signed)
Form filled out and placed in Jose Shea's box.  

## 2014-06-06 NOTE — Telephone Encounter (Signed)
Pt called to give [place] the fax number to use for his FMLA papers [phone] attention Sabrina. He would also like the orginals left up front for pickup. Areatha Keas

## 2014-06-07 NOTE — Telephone Encounter (Signed)
LMOVM informing pt that his form was completed and ready for pick up. Fleeger, Salome Spotted

## 2014-06-13 ENCOUNTER — Telehealth: Payer: Self-pay | Admitting: Family Medicine

## 2014-06-13 NOTE — Telephone Encounter (Signed)
Placed in PCP box for completion 

## 2014-06-13 NOTE — Telephone Encounter (Signed)
Pt brought in forms to be completed by dr regarding his blood pressure Will pick up when ready

## 2014-06-14 NOTE — Telephone Encounter (Signed)
Form filled out and placed on RN Tamika Martin's desk.

## 2014-06-15 NOTE — Telephone Encounter (Signed)
Left message on voicemail that paper work was ready to be picked up.

## 2014-07-22 ENCOUNTER — Other Ambulatory Visit: Payer: Self-pay | Admitting: Family Medicine

## 2014-08-21 ENCOUNTER — Other Ambulatory Visit: Payer: Self-pay | Admitting: Family Medicine

## 2014-10-17 ENCOUNTER — Other Ambulatory Visit: Payer: Self-pay | Admitting: Family Medicine

## 2015-02-18 ENCOUNTER — Other Ambulatory Visit: Payer: Self-pay | Admitting: *Deleted

## 2015-02-18 MED ORDER — NADOLOL 80 MG PO TABS: 160.0000 mg | ORAL_TABLET | Freq: Every day | ORAL | Status: DC

## 2015-02-18 NOTE — Telephone Encounter (Signed)
Patient needs to be seen for HTN f/u before more refills will be given.  Virginia Crews, MD, MPH PGY-2,  [facility] 02/18/2015 12:16 PM

## 2015-02-19 NOTE — Telephone Encounter (Signed)
Pt informed and scheduled an appt. Deseree Blount, CMA  

## 2015-03-11 ENCOUNTER — Other Ambulatory Visit: Payer: BLUE CROSS/BLUE SHIELD

## 2015-03-11 DIAGNOSIS — Z1159 Encounter for screening for other viral diseases: Secondary | ICD-10-CM

## 2015-03-11 DIAGNOSIS — E785 Hyperlipidemia, unspecified: Secondary | ICD-10-CM

## 2015-03-11 LAB — LIPID PANEL
CHOL/HDL RATIO: 4.9 ratio (ref <=5.0)
CHOLESTEROL: 188 mg/dL (ref 125–200)
HDL: 38 mg/dL — ABNORMAL LOW (ref >=40)
LDL CALC: 119 mg/dL (ref <130)
TRIGLYCERIDES: 154 mg/dL — AB (ref <150)
VLDL: 31 mg/dL — ABNORMAL HIGH (ref <30)

## 2015-03-11 NOTE — Progress Notes (Signed)
FLP AND HEP C DONE TODAY Jose Shea

## 2015-03-12 ENCOUNTER — Ambulatory Visit (INDEPENDENT_AMBULATORY_CARE_PROVIDER_SITE_OTHER): Payer: BLUE CROSS/BLUE SHIELD | Admitting: Family Medicine

## 2015-03-12 ENCOUNTER — Encounter: Payer: Self-pay | Admitting: Family Medicine

## 2015-03-12 VITALS — BP 135/83 | HR 52 | Temp 98.3°F | Ht 68.0 in | Wt 147.0 lb

## 2015-03-12 DIAGNOSIS — M129 Arthropathy, unspecified: Secondary | ICD-10-CM | POA: Diagnosis not present

## 2015-03-12 DIAGNOSIS — I10 Essential (primary) hypertension: Secondary | ICD-10-CM

## 2015-03-12 DIAGNOSIS — Z114 Encounter for screening for human immunodeficiency virus [HIV]: Secondary | ICD-10-CM

## 2015-03-12 DIAGNOSIS — M160 Bilateral primary osteoarthritis of hip: Secondary | ICD-10-CM

## 2015-03-12 DIAGNOSIS — Z131 Encounter for screening for diabetes mellitus: Secondary | ICD-10-CM

## 2015-03-12 DIAGNOSIS — E785 Hyperlipidemia, unspecified: Secondary | ICD-10-CM

## 2015-03-12 LAB — HEPATITIS C ANTIBODY: HCV AB: NEGATIVE

## 2015-03-12 NOTE — Progress Notes (Signed)
Subjective:    Patient ID: Jose Shea, male    DOB: 14-Mar-1956, 59 y.o.   MRN: [id number]  Jose Shea is a 59 y.o. male presenting on 03/12/2015 for Hypertension   HPI   CHRONIC HTN: Reports - no concerns, feels well. Does not check BP outside of office Current Meds - Lisinopril 10mg  daily, Nadolol 80mg  BID   Reports good compliance, took meds today. Tolerating well, w/o complaints. Due for BMET today Denies CP, dyspnea, HA, edema, dizziness / lightheadedness  DIABETES SCREENING: - 2 years since last glucose check, no A1c on chart. No prior diagnosis of Pre-DM. - Admits to family history of DM2 - Fasting today  HYPERLIPIDEMIA: - Last fasting lipid panel recently checked 03/11/15 (from future orders, but pt had not drawn until yesterday). Previously had lipids for comparison in 04/2013. - Today patient reports known history of elevated cholesterol, has discussed statin therapy with previous doctors, but has not started, as he had planned to work on improving lifestyle. - Admits to family history of MI in Father approx age 18  ARTHRITIS, BILATERAL HIPS: - Chronic history of intermittent episodes of hip pain, seems to alternate sides. Previously worked up about 1 year ago with bilateral pelvis X-ray showing mild DJD in both hips. Additionally, admits to known h/o DJD in other joints, specifically C4-C6 spine and L4-5 with prior dx bulging discs. - Today requesting to review results of this X-ray - Currently denies any active hip pain and functioning well. 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 Plans to get DOT physical at [facility] in Nov 2016.  Past Medical History  Diagnosis Date  . Restless leg   . Hypertension   . Chronic headaches     Current Outpatient Prescriptions on File Prior to Visit    Medication Sig  . aspirin (ASPIR-LOW) 81 MG EC tablet Take 81 mg by mouth daily.    Marland Kitchen doxepin (SINEQUAN) 50 MG capsule TAKE 1 CAPSULE (50 MG TOTAL) BY MOUTH AT BEDTIME.  . fluticasone (FLONASE) 50 MCG/ACT nasal spray USE 2 SPRAYS INTO EACH NOSTRIL DAILY  . lisinopril (PRINIVIL,ZESTRIL) 10 MG tablet TAKE 1 TABLET BY MOUTH EVERY DAY  . methocarbamol (ROBAXIN) 500 MG tablet TAKE 1 TABLET BY MOUTH AT BEDTIME AS NEEDED FOR MUSCLE SPASMS  . nadolol (CORGARD) 80 MG tablet Take 2 tablets (160 mg total) by mouth daily.  . naratriptan (AMERGE) 2.5 MG tablet TAKE ONE TABLET AT ONSET OF HEADACHE IF RETURNS OR DOES NOT RESOLVE, MAY REPEAT AFTER 4 HOURS  . rOPINIRole (REQUIP) 0.25 MG tablet TAKE 1 TABLET BY MOUTH 1-2 HOURS BEFORE BEDTIME FOR RESTLESS LEG SYNDROME AS NEEDED   No current facility-administered medications on file prior to visit.    Review of Systems  Constitutional: Negative for fever, chills, diaphoresis, activity change, appetite change and fatigue.  HENT: Negative for congestion and hearing loss.   Eyes: Negative for visual disturbance.  Respiratory: Negative for cough, chest tightness, shortness of breath and wheezing.   Cardiovascular: Negative for chest pain, palpitations and leg swelling.  Gastrointestinal: Negative for nausea, vomiting, abdominal pain, diarrhea and constipation.  Endocrine: Negative for polydipsia, polyphagia and polyuria.  Genitourinary: Negative for dysuria, frequency and hematuria.  Musculoskeletal: Negative for arthralgias (intermittent episodes, no active) and neck pain.  Skin: Negative for rash.  Neurological: Negative for dizziness, weakness, light-headedness, numbness and headaches.  Hematological: Negative for adenopathy.   Per HPI unless specifically indicated above     Objective:    BP 135/83 mmHg  Pulse 52  Temp(Src) 98.3 F (36.8 C) (Oral)  Ht 5\' 8"  (1.727 m)  Wt 147 lb (66.679 kg)  BMI 22.36 kg/m2  Wt Readings from Last 3 Encounters:   03/12/15 147 lb (66.679 kg)  06/05/14 146 lb (66.225 kg)  05/27/14 147 lb (66.679 kg)    Physical Exam  Constitutional: He is oriented to person, place, and time. He appears well-developed and well-nourished. No distress.  HENT:  Head: Normocephalic and atraumatic.  Mouth/Throat: Oropharynx is clear and moist.  Eyes: Conjunctivae and EOM are normal. Pupils are equal, round, and reactive to light.  Neck: Normal range of motion. Neck supple. No thyromegaly present.  Cardiovascular: Normal rate, regular rhythm, normal heart sounds and intact distal pulses.   No murmur heard. Pulmonary/Chest: Effort normal and breath sounds normal. No respiratory distress. He has no wheezes. He has no rales.  Abdominal: Soft. There is no tenderness.  Musculoskeletal: Normal range of motion. He exhibits no edema or tenderness.       Right hip: Normal. He exhibits normal range of motion, normal strength, no tenderness and no bony tenderness.       Left hip: Normal. He exhibits normal range of motion, normal strength, no tenderness and no bony tenderness.       Lumbar back: Normal. He exhibits no tenderness and no deformity.  Lymphadenopathy:    He has no cervical adenopathy.  Neurological: He is alert and oriented to person, place, and time.  Skin: Skin is warm and dry. No rash noted. He is not diaphoretic.  Nursing note and vitals reviewed.  Results for orders placed or performed in visit on 95/09/32  BASIC METABOLIC PANEL WITH GFR  Result Value Ref Range   Sodium 138 135 - 146 mmol/L   Potassium 4.5 3.5 - 5.3 mmol/L   Chloride 102 98 - 110 mmol/L   CO2 25 20 - 31 mmol/L   Glucose, Bld 84 65 - 99 mg/dL   BUN 18 7 - 25 mg/dL   Creat 0.84 0.70 - 1.33 mg/dL   Calcium 9.4 8.6 - 10.3 mg/dL   GFR, Est African American >89 >=60 mL/min   GFR, Est Non African American >89 >=60 mL/min  HIV antibody  Result Value Ref Range   HIV 1&2 Ab, 4th Generation NONREACTIVE NONREACTIVE      Assessment & Plan:    Problem List Items Addressed This Visit      Cardiovascular and Mediastinum   HYPERTENSION, BENIGN SYSTEMIC - Primary    Controlled HTN, at goal. Note HR is low normal at 52 on Nadolol BB No complications   Plan:  1. Continue current BP meds.  2. Check BMET (fasting) monitor SCr (stable @ 0.84)  3. Lifestyle Mods - Improve regular exercise, Dec salt intake, inc K+ rich vegs 4. Monitor BP at home or at drug store occasionally.       Relevant Orders   BASIC METABOLIC PANEL WITH GFR (Completed)     Musculoskeletal and Integument   Bilateral hip joint arthritis    Stable w/o pain today. H/o intermittent flares, alternating sides. Consistent with OA. - Last X-ray 7/15 with mild DJD (reviewed today)  Plan: 1. Continue conservative therapy with Tylenol PRN, reviewed appropriate dosage 2. Relative rest, heating pad, ice packs PRN 3. F/u PRN  Other   HLD (hyperlipidemia)    Improved HLD with lifestyle changes. Not currently on statin therapy, previously discussed and declined 1 year ago. - Last fasting lipids 03/11/15: stable total cholesterol, 188, improved HDL 33 to 38, improved TG 195 to 154, stable LDL 114 to 119 - ASCVD 10 yr Risk score up from 7.2 to 9.2%, likely due to elevated BP today (still normal range)  Plan: 1. Discussion on statin therapy given review of recent lipids. Given 10 yr risk score >7.5%, recommended starting moderate intensity statin therapy. Patient will consider and re-discuss with PCP at future visit. 2. Continue to work on lifestyle mods       Other Visit Diagnoses    Screening for HIV (human immunodeficiency virus)        Routine HIV screen. Results non-reactive    Relevant Orders    HIV antibody (Completed)    Screening for diabetes mellitus        Routine DM screen with fasting BMET for glucose. Negative with glucose 84. Consider A1c in future       Meds ordered this encounter  Medications  . cetirizine (ZYRTEC) 10 MG tablet     Sig: Take 10 mg by mouth daily.      Follow up plan: Return in about 1 month (around 04/12/2015) for cholesterol, lab results.  Nobie Putnam, [place], PGY-3

## 2015-03-12 NOTE — Patient Instructions (Signed)
Dear Jose Shea, Thank you for coming in to clinic today. It was good to see you!  1. Your blood pressure is doing well. No change to meds. 2. Reviewed results and X-rays - You have mild arthritis in both hips from prior x-ray -Cholesterol is elevated, improved from before. As discussed, I recommend starting Statin medication - (Lipitor, Atorvastatin, Crestor, there are a variety of them, look into these and talk to friends/family) 3. Take Tylenol Extra Str 500mg  1-2 tabs up to 3 times daily as needed for hip / back pain, use heating pad and topical treatments as needed  Basic blood work today, including kidneys and sugar test  Screen HIV  Some important numbers from today's visit: BP 135/83 mmHg  Pulse 52  Temp(Src) 98.3 F (36.8 C) (Oral)  Ht 5\' 8"  (1.727 m)  Wt 147 lb (66.679 kg)  BMI 22.36 kg/m2   Please schedule a follow-up appointment with Dr. Brita Romp in 1 month to review lab results and discuss Cholesterol Medication  If you have any other questions or concerns, please feel free to call the clinic to contact me. You may also schedule an earlier appointment if necessary.  However, if your symptoms get significantly worse, please go to the Emergency Department to seek immediate medical attention.  Nobie Putnam, [place]

## 2015-03-13 LAB — BASIC METABOLIC PANEL WITH GFR
BUN: 18 mg/dL (ref 7–25)
CHLORIDE: 102 mmol/L (ref 98–110)
CO2: 25 mmol/L (ref 20–31)
CREATININE: 0.84 mg/dL (ref 0.70–1.33)
Calcium: 9.4 mg/dL (ref 8.6–10.3)
GFR, Est Non African American: >89 mL/min (ref >=60)
Glucose, Bld: 84 mg/dL (ref 65–99)
POTASSIUM: 4.5 mmol/L (ref 3.5–5.3)
SODIUM: 138 mmol/L (ref 135–146)

## 2015-03-13 LAB — HIV ANTIBODY (ROUTINE TESTING W REFLEX): HIV: NONREACTIVE

## 2015-03-13 NOTE — Assessment & Plan Note (Signed)
Improved HLD with lifestyle changes. Not currently on statin therapy, previously discussed and declined 1 year ago. - Last fasting lipids 03/11/15: stable total cholesterol, 188, improved HDL 33 to 38, improved TG 195 to 154, stable LDL 114 to 119 - ASCVD 10 yr Risk score up from 7.2 to 9.2%, likely due to elevated BP today (still normal range)  Plan: 1. Discussion on statin therapy given review of recent lipids. Given 10 yr risk score >7.5%, recommended starting moderate intensity statin therapy. Patient will consider and re-discuss with PCP at future visit. 2. Continue to work on lifestyle mods

## 2015-03-13 NOTE — Assessment & Plan Note (Signed)
Controlled HTN, at goal. Note HR is low normal at 52 on Nadolol BB No complications   Plan:  1. Continue current BP meds.  2. Check BMET (fasting) monitor SCr (stable @ 0.84)  3. Lifestyle Mods - Improve regular exercise, Dec salt intake, inc K+ rich vegs 4. Monitor BP at home or at drug store occasionally.

## 2015-03-13 NOTE — Assessment & Plan Note (Signed)
Stable w/o pain today. H/o intermittent flares, alternating sides. Consistent with OA. - Last X-ray 7/15 with mild DJD (reviewed today)  Plan: 1. Continue conservative therapy with Tylenol PRN, reviewed appropriate dosage 2. Relative rest, heating pad, ice packs PRN 3. F/u PRN

## 2015-04-15 ENCOUNTER — Other Ambulatory Visit: Payer: Self-pay | Admitting: *Deleted

## 2015-04-15 MED ORDER — NADOLOL 80 MG PO TABS: 160.0000 mg | ORAL_TABLET | Freq: Every day | ORAL | Status: DC

## 2015-04-22 ENCOUNTER — Ambulatory Visit: Payer: BLUE CROSS/BLUE SHIELD | Admitting: Family Medicine

## 2015-04-23 ENCOUNTER — Ambulatory Visit (INDEPENDENT_AMBULATORY_CARE_PROVIDER_SITE_OTHER): Payer: BLUE CROSS/BLUE SHIELD | Admitting: Family Medicine

## 2015-04-23 ENCOUNTER — Encounter: Payer: Self-pay | Admitting: Family Medicine

## 2015-04-23 VITALS — BP 134/89 | HR 51 | Temp 98.1°F | Ht 68.0 in | Wt 154.0 lb

## 2015-04-23 DIAGNOSIS — M25551 Pain in right hip: Secondary | ICD-10-CM | POA: Diagnosis not present

## 2015-04-23 DIAGNOSIS — E785 Hyperlipidemia, unspecified: Secondary | ICD-10-CM

## 2015-04-23 DIAGNOSIS — Z23 Encounter for immunization: Secondary | ICD-10-CM

## 2015-04-23 NOTE — Assessment & Plan Note (Signed)
Patient is complaining of some right hip pain that occasionally radiates down leg. Signs and symptoms seem to point towards possible SI joint inflammation versus piriformis syndrome.  No red flag symptoms at this time. -  I provided patient some strengthening exercises for his legs. -  Encouraged ice/heat for discomfort -  Ibuprofen or Aleve for discomfort -  Follow-up in 6 weeks

## 2015-04-23 NOTE — Assessment & Plan Note (Signed)
We discussed this issue. Patient was very open to initiating statin therapy at this time. However upon discussion of the side effects of statin therapy patient eventually decided to hold off as he did not want the initiation of his new strengthening exercises to mask side effects of this medication. He agreed that at our next visit we would likely be initiating a statin.

## 2015-04-23 NOTE — Progress Notes (Signed)
   HPI  CC:  Right leg pain  Patient is here with complaints of right leg pain. He states that he has occasionally had severe right leg pain that lasts a couple minutes. This pain will  Began in the posterior lateral hip and sometimes travel down to his calf.  Pain is aching in nature , and does not persist. He states that he typically has this type of pain when he is twisting or pulling. He denies any injury or trauma to this area. He does report a long history of  Vertebral disc issues. He denies any electric type pain down his legs. He denies any weakness, numbness.  He also denies any red flag symptoms like bowel/bladder incontinence or saddle anesthesia. He does report that when he has this pain he will take time to stretch it out and that this has helped in the past.  Patient and I also discussed his recent lipid panel.  We discussed some of his risk factors and where we would like to go from here. He seems to be relatively open to statin therapy at this time.  ROS:  Denies fever, chills, night sweats, weakness, numbness, paresthesias, recent weight loss, groin pain, recent falls, injury or trauma.  Objective: BP 134/89 mmHg  Pulse 51  Temp(Src) 98.1 F (36.7 C) (Oral)  Ht 5\' 8"  (1.727 m)  Wt 154 lb (69.854 kg)  BMI 23.42 kg/m2 Gen: NAD, alert, cooperative, and pleasant. HEENT: NCAT, EOMI, PERRL CV: RRR, no murmur Resp: CTAB, no wheezes, non-labored Right Hip: ROM  Full; Strength: Flexion: 5/5, Extension: 5/5, Abduction: 5/5, Adduction: 5/5. Pelvic alignment unremarkable to inspection and palpation. Standing hip rotation and gait without trendelenburg / unsteadiness. Greater trochanter without tenderness to palpation. No tenderness over piriformis. No SI joint tenderness and normal minimal SI movement. Neuro: Alert and oriented, Speech clear, No gross deficits, DTRs  symmetric  Assessment and plan:  Right hip pain  Patient is complaining of some right hip pain that occasionally  radiates down leg. Signs and symptoms seem to point towards possible SI joint inflammation versus piriformis syndrome.  No red flag symptoms at this time. -  I provided patient some strengthening exercises for his legs. -  Encouraged ice/heat for discomfort -  Ibuprofen or Aleve for discomfort -  Follow-up in 6 weeks  HLD (hyperlipidemia)  We discussed this issue. Patient was very open to initiating statin therapy at this time. However upon discussion of the side effects of statin therapy patient eventually decided to hold off as he did not want the initiation of his new strengthening exercises to mask side effects of this medication. He agreed that at our next visit we would likely be initiating a statin.    Orders Placed This Encounter  Procedures  . Flu Vaccine QUAD 36+ mos IM     Elberta Leatherwood, MD,MS,  PGY2 04/23/2015 7:05 PM

## 2015-04-23 NOTE — Patient Instructions (Signed)
Hip Exercises RANGE OF MOTION (ROM) AND STRETCHING EXERCISES  These exercises may help you when beginning to rehabilitate your injury. Doing them too aggressively can worsen your condition. Complete them slowly and gently. Your symptoms may resolve with or without further involvement from your physician, physical therapist or athletic trainer. While completing these exercises, remember:   Restoring tissue flexibility helps normal motion to return to the joints. This allows healthier, less painful movement and activity.  An effective stretch should be held for at least 30 seconds.  A stretch should never be painful. You should only feel a gentle lengthening or release in the stretched tissue. If these stretches worsen your symptoms even when done gently, consult your physician, physical therapist or athletic trainer. STRETCH - Hamstrings, Supine   Lie on your back. Loop a belt or towel over the ball of your right / left foot.  Straighten your right / left knee and slowly pull on the belt to raise your leg. Do not allow the right / left knee to bend. Keep your opposite leg flat on the floor.  Raise the leg until you feel a gentle stretch behind your right / left knee or thigh. Hold this position for __________ seconds. Repeat __________ times. Complete this stretch __________ times per day.  STRETCH - Hip Rotators   Lie on your back on a firm surface. Grasp your right / left knee with your right / left hand and your ankle with your opposite hand.  Keeping your hips and shoulders firmly planted, gently pull your right / left knee and rotate your lower leg toward your opposite shoulder until you feel a stretch in your buttocks.  Hold this stretch for __________ seconds. Repeat this stretch __________ times. Complete this stretch __________ times per day. STRETCH - Hamstrings/Adductors, V-Sit   Sit on the floor with your legs extended in a large "V," keeping your knees straight.  With your  head and chest upright, bend at your waist reaching for your right foot to stretch your left adductors.  You should feel a stretch in your left inner thigh. Hold for __________ seconds.  Return to the upright position to relax your leg muscles.  Continuing to keep your chest upright, bend straight forward at your waist to stretch your hamstrings.  You should feel a stretch behind both of your thighs and/or knees. Hold for __________ seconds.  Return to the upright position to relax your leg muscles.  Repeat steps 2 through 4 for opposite leg. Repeat __________ times. Complete this exercise __________ times per day.  STRETCHING - Hip Flexors, Lunge  Half kneel with your right / left knee on the floor and your opposite knee bent and directly over your ankle.  Keep good posture with your head over your shoulders. Tighten your buttocks to point your tailbone downward; this will prevent your back from arching too much.  You should feel a gentle stretch in the front of your thigh and/or hip. If you do not feel any resistance, slightly slide your opposite foot forward and then slowly lunge forward so your knee once again lines up over your ankle. Be sure your tailbone remains pointed downward.  Hold this stretch for __________ seconds. Repeat __________ times. Complete this stretch __________ times per day. STRENGTHENING EXERCISES These exercises may help you when beginning to rehabilitate your injury. They may resolve your symptoms with or without further involvement from your physician, physical therapist or athletic trainer. While completing these exercises, remember:   Muscles  can gain both the endurance and the strength needed for everyday activities through controlled exercises.  Complete these exercises as instructed by your physician, physical therapist or athletic trainer. Progress the resistance and repetitions only as guided.  You may experience muscle soreness or fatigue, but the  pain or discomfort you are trying to eliminate should never worsen during these exercises. If this pain does worsen, stop and make certain you are following the directions exactly. If the pain is still present after adjustments, discontinue the exercise until you can discuss the trouble with your clinician. STRENGTH - Hip Extensors, Bridge   Lie on your back on a firm surface. Bend your knees and place your feet flat on the floor.  Tighten your buttocks muscles and lift your bottom off the floor until your trunk is level with your thighs. You should feel the muscles in your buttocks and back of your thighs working. If you do not feel these muscles, slide your feet 1-2 inches further away from your buttocks.  Hold this position for __________ seconds.  Slowly lower your hips to the starting position and allow your buttock muscles relax completely before beginning the next repetition.  If this exercise is too easy, you may cross your arms over your chest. Repeat __________ times. Complete this exercise __________ times per day.  STRENGTH - Hip Abductors, Straight Leg Raises  Be aware of your form throughout the entire exercise so that you exercise the correct muscles. Sloppy form means that you are not strengthening the correct muscles.  Lie on your side so that your head, shoulders, knee and hip line up. You may bend your lower knee to help maintain your balance. Your right / left leg should be on top.  Roll your hips slightly forward, so that your hips are stacked directly over each other and your right / left knee is facing forward.  Lift your top leg up 4-6 inches, leading with your heel. Be sure that your foot does not drift forward or that your knee does not roll toward the ceiling.  Hold this position for __________ seconds. You should feel the muscles in your outer hip lifting (you may not notice this until your leg begins to tire).  Slowly lower your leg to the starting position. Allow  the muscles to fully relax before beginning the next repetition. Repeat __________ times. Complete this exercise __________ times per day.  STRENGTH - Hip Adductors, Straight Leg Raises   Lie on your side so that your head, shoulders, knee and hip line up. You may place your upper foot in front to help maintain your balance. Your right / left leg should be on the bottom.  Roll your hips slightly forward, so that your hips are stacked directly over each other and your right / left knee is facing forward.  Tense the muscles in your inner thigh and lift your bottom leg 4-6 inches. Hold this position for __________ seconds.  Slowly lower your leg to the starting position. Allow the muscles to fully relax before beginning the next repetition. Repeat __________ times. Complete this exercise __________ times per day.  STRENGTH - Quadriceps, Straight Leg Raises  Quality counts! Watch for signs that the quadriceps muscle is working to insure you are strengthening the correct muscles and not "cheating" by substituting with healthier muscles.  Lay on your back with your right / left leg extended and your opposite knee bent.  Tense the muscles in the front of your right / left  thigh. You should see either your knee cap slide up or increased dimpling just above the knee. Your thigh may even quiver.  Tighten these muscles even more and raise your leg 4 to 6 inches off the floor. Hold for right / left seconds.  Keeping these muscles tense, lower your leg.  Relax the muscles slowly and completely in between each repetition. Repeat __________ times. Complete this exercise __________ times per day.  STRENGTH - Hip Abductors, Standing  Tie one end of a rubber exercise band/tubing to a secure surface (table, pole) and tie a loop at the other end.  Place the loop around your right / left ankle. Keeping your ankle with the band directly opposite of the secured end, step away until there is tension in the  tube/band.  Hold onto a chair as needed for balance.  Keeping your back upright, your shoulders over your hips, and your toes pointing forward, lift your right / left leg out to your side. Be sure to lift your leg with your hip muscles. Do not "throw" your leg or tip your body to lift your leg.  Slowly and with control, return to the starting position. Repeat exercise __________ times. Complete this exercise __________ times per day.  STRENGTH - Quadriceps, Squats  Stand in a door frame so that your feet and knees are in line with the frame.  Use your hands for balance, not support, on the frame.  Slowly lower your weight, bending at the hips and knees. Keep your lower legs upright so that they are parallel with the door frame. Squat only within the range that does not increase your knee pain. Never let your hips drop below your knees.  Slowly return upright, pushing with your legs, not pulling with your hands. Document Released: 07/31/2005 Document Revised: 10/05/2011 Document Reviewed: 10/25/2008 [facility] Patient Information 2015 [place], [place]. This information is not intended to replace advice given to you by your health care provider. Make sure you discuss any questions you have with your health care provider.

## 2015-05-14 ENCOUNTER — Other Ambulatory Visit: Payer: Self-pay | Admitting: Family Medicine

## 2015-05-14 MED ORDER — LISINOPRIL 10 MG PO TABS: 10.0000 mg | ORAL_TABLET | Freq: Every day | ORAL | Status: DC

## 2015-05-14 MED ORDER — DOXEPIN HCL 50 MG PO CAPS: ORAL_CAPSULE | ORAL | Status: DC

## 2015-07-09 ENCOUNTER — Other Ambulatory Visit: Payer: Self-pay | Admitting: Family Medicine

## 2015-08-02 ENCOUNTER — Encounter: Payer: Self-pay | Admitting: Family Medicine

## 2015-08-02 ENCOUNTER — Ambulatory Visit (INDEPENDENT_AMBULATORY_CARE_PROVIDER_SITE_OTHER): Payer: BLUE CROSS/BLUE SHIELD | Admitting: Family Medicine

## 2015-08-02 VITALS — BP 147/92 | HR 54 | Temp 97.9°F | Ht 68.0 in | Wt 156.8 lb

## 2015-08-02 DIAGNOSIS — M79604 Pain in right leg: Secondary | ICD-10-CM | POA: Insufficient documentation

## 2015-08-02 DIAGNOSIS — M545 Low back pain, unspecified: Secondary | ICD-10-CM | POA: Insufficient documentation

## 2015-08-02 DIAGNOSIS — M5441 Lumbago with sciatica, right side: Secondary | ICD-10-CM

## 2015-08-02 NOTE — Progress Notes (Signed)
HPI  CC: BACK PAIN Was seen previously for this issue. Initially better w/ exercises, ice/heat, and ibuprofen. Now feeling worse. Originates in sacrum/lumbar, radiates posteriolaterally as far as calf. No motion seems to exacerbate.  Worse first thing in AM. Better w/ rest  Back pain began in august, got better, then flaired back up in November. Pain is described as "hard". Patient has tried exercises, icy/hot, heat, ibuprofen. Pain radiates to calf. History of trauma or injury: no Prior history of similar pain: yes but only w/in the next 6 months History of cancer: no Weak immune system:  no History of IV drug use: no History of steroid use: no  Symptoms Incontinence of bowel or bladder:  no Numbness of leg: occasional Fever: no Rest or Night pain: no Weight Loss:  no Rash: no  Patient believes it is a bulging disk might be causing their pain.  ROS see HPI Smoking Status noted.  Past medical history and social history reviewed and updated in the EMR as appropriate.  Objective: BP 147/92 mmHg  Pulse 54  Temp(Src) 97.9 F (36.6 C) (Oral)  Ht 5\' 8"  (1.727 m)  Wt 156 lb 12.8 oz (71.124 kg)  BMI 23.85 kg/m2 Gen: NAD, alert, cooperative, and pleasant. Back Exam: Inspection: No evidence of erythema/ecchymoses/bony deformity Motion: Full range of motion SLR seated: Some mild radiation to the distal right calf, patient states this is not his typical pain. Flexibility: Normal Palpable tenderness: Some tenderness noted at the right SI joint FABER: Without significant difficulty Sensory change: None Reflex change: +3 right patellar reflex, +2 left patellar reflex; all other reflexes present and equal bilaterally  Strength at foot - Plantar-flexion: 5 / 5    Dorsi-flexion: 5 / 5    Eversion: 5 / 5   Inversion: 5 / 5 Leg strength:  - Quad: 5 / 5   Hamstring: 4 / 5   Hip flexor: 5 / 5   Hip abductors: 5 / 5 Gait: - Walking: Normal         Heels: Without difficulty           Toes: Without difficulty           Tandem: Without difficulty Neuro: Alert and oriented, Speech clear, No gross deficits  Assessment and plan:  Low back pain radiating to right leg Patient is here complaining of persistent low back pain that radiates down the right leg. Patient was seen by me this past September for a similar issue. The exercises and treatment plan I provided at that time provided temporary relief but his symptoms have since come back with some possible worsening. Exam today yielded a hyperreflexive patellar reflex on the right side compared to the left. This exam was repeated with consistent results. Straight leg raise showed some positive characteristics however likely is considered negative due to the lack of reproducing the symptoms he is experiencing outside the office. - We'll obtain a lumbar MRI without contrast to rule out any nerve impingement. - I advised him to continue ice/heat, NSAIDs (Aleve, 2 tabs BID), and stretching exercises as tolerated. - We discussed red flag symptoms for him to be aware of and what to do if they present. - Patient to follow-up after MRI is obtained.    Orders Placed This Encounter  Procedures  . MR Lumbar Spine Wo Contrast    Standing Status: Future     Number of Occurrences:      Standing Expiration Date: 09/29/2016    Order Specific  Question:  Reason for Exam (SYMPTOM  OR DIAGNOSIS REQUIRED)    Answer:  Rt leg radiculopathy    Order Specific Question:  Preferred imaging location?    Answer:  [facility]    Order Specific Question:  Does the patient have a pacemaker or implanted devices?    Answer:  No    Order Specific Question:  What is the patient's sedation requirement?    Answer:  No Sedation    Elberta Leatherwood, MD,MS,  PGY2 08/02/2015 5:56 PM

## 2015-08-02 NOTE — Assessment & Plan Note (Addendum)
Patient is here complaining of persistent low back pain that radiates down the right leg. Patient was seen by me this past September for a similar issue. The exercises and treatment plan I provided at that time provided temporary relief but his symptoms have since come back with some possible worsening. Exam today yielded a hyperreflexive patellar reflex on the right side compared to the left. This exam was repeated with consistent results. Straight leg raise showed some positive characteristics however likely is considered negative due to the lack of reproducing the symptoms he is experiencing outside the office. - We'll obtain a lumbar MRI without contrast to rule out any nerve impingement. - I advised him to continue ice/heat, NSAIDs (Aleve, 2 tabs BID), and stretching exercises as tolerated. - We discussed red flag symptoms for him to be aware of and what to do if they present. - Patient to follow-up after MRI is obtained.

## 2015-08-02 NOTE — Patient Instructions (Addendum)
It was a pleasure seeing you today in our clinic. Today we discussed your back and leg pain. Here is the treatment plan we have discussed and agreed upon together:   - Today I have placed an order to have you undergo an MRI of your lower spine. You'll be contacted in a few days to have this scheduled. - I would like to see you shortly after this procedure is been done. At that time we will discuss our treatment plan moving forward. - I would like you to continue performing the exercises I provided you at the previous visit. - Using ice or heat may help with the pain as well. - For some of this pain during work try taking 2 tablets of over-the-counter Aleve twice a day. While doing so make sure to keep yourself well hydrated with water.  - Call me at our office, or report to the nearest urgent care/emergency department if you have any worsening symptoms, numbness of either leg, or weakness in the muscles of your leg.

## 2015-08-10 ENCOUNTER — Ambulatory Visit
Admission: RE | Admit: 2015-08-10 | Discharge: 2015-08-10 | Disposition: A | Discharge status: Home / Self Care | Payer: BLUE CROSS/BLUE SHIELD | Source: Ambulatory Visit | Attending: Family Medicine | Admitting: Family Medicine

## 2015-08-10 DIAGNOSIS — M5441 Lumbago with sciatica, right side: Secondary | ICD-10-CM

## 2015-08-13 ENCOUNTER — Telehealth: Payer: Self-pay | Admitting: Family Medicine

## 2015-08-13 NOTE — Telephone Encounter (Signed)
Pt called because he has a MRI done on 08/10/15 and was hoping the doctor would give him his results. jw

## 2015-08-14 ENCOUNTER — Other Ambulatory Visit: Payer: Self-pay | Admitting: Family Medicine

## 2015-08-14 ENCOUNTER — Telehealth: Payer: Self-pay | Admitting: Family Medicine

## 2015-08-14 DIAGNOSIS — M48061 Spinal stenosis, lumbar region without neurogenic claudication: Secondary | ICD-10-CM

## 2015-08-14 NOTE — Telephone Encounter (Signed)
Opened in error

## 2015-08-14 NOTE — Telephone Encounter (Signed)
Called patient to discuss the results of his MRI. There appears to be some substantial narrowing specifically at his L4/5 level. Patient reports continued symptoms with no changes at this time. Patient is currently interested in orthopedic referral. Has been previously seen at [facility] orthopedic. I will place a referral at this time.

## 2015-08-26 ENCOUNTER — Other Ambulatory Visit: Payer: Self-pay | Admitting: Family Medicine

## 2015-10-01 ENCOUNTER — Other Ambulatory Visit: Payer: Self-pay | Admitting: Family Medicine

## 2015-12-04 ENCOUNTER — Telehealth: Payer: Self-pay | Admitting: Family Medicine

## 2015-12-04 NOTE — Telephone Encounter (Signed)
Patient asks PCP to help him with insurance forms. Please, follow up.

## 2015-12-04 NOTE — Telephone Encounter (Signed)
Form placed in PCP box 

## 2015-12-05 ENCOUNTER — Other Ambulatory Visit: Payer: Self-pay | Admitting: Orthopedic Surgery

## 2015-12-12 NOTE — Telephone Encounter (Signed)
Form completed and placed up front for patient to pick up (couldn't mail b/c patient didn't fully fill out his side)

## 2015-12-13 ENCOUNTER — Encounter (HOSPITAL_COMMUNITY)
Admission: RE | Admit: 2015-12-13 | Discharge: 2015-12-13 | Disposition: A | Discharge status: Home / Self Care | Payer: BLUE CROSS/BLUE SHIELD | Source: Ambulatory Visit | Attending: Orthopedic Surgery | Admitting: Orthopedic Surgery

## 2015-12-13 ENCOUNTER — Encounter (HOSPITAL_COMMUNITY): Payer: Self-pay

## 2015-12-13 DIAGNOSIS — Z01818 Encounter for other preprocedural examination: Secondary | ICD-10-CM | POA: Diagnosis not present

## 2015-12-13 DIAGNOSIS — R001 Bradycardia, unspecified: Secondary | ICD-10-CM | POA: Diagnosis not present

## 2015-12-13 DIAGNOSIS — M79604 Pain in right leg: Secondary | ICD-10-CM | POA: Insufficient documentation

## 2015-12-13 DIAGNOSIS — Z01812 Encounter for preprocedural laboratory examination: Secondary | ICD-10-CM | POA: Insufficient documentation

## 2015-12-13 HISTORY — DX: Pneumonia, unspecified organism: J18.9

## 2015-12-13 HISTORY — DX: Gastro-esophageal reflux disease without esophagitis: K21.9

## 2015-12-13 HISTORY — DX: Disease of blood and blood-forming organs, unspecified: D75.9

## 2015-12-13 HISTORY — DX: Other specified postprocedural states: R11.2

## 2015-12-13 HISTORY — DX: Other specified postprocedural states: Z98.890

## 2015-12-13 LAB — CBC WITH DIFFERENTIAL/PLATELET
BASOS ABS: 0 10*3/uL (ref 0.0–0.1)
Basophils Relative: 1 %
EOS PCT: 9 %
Eosinophils Absolute: 0.5 10*3/uL (ref 0.0–0.7)
HEMATOCRIT: 46.5 % (ref 39.0–52.0)
HEMOGLOBIN: 15.2 g/dL (ref 13.0–17.0)
LYMPHS PCT: 20 %
Lymphs Abs: 1.2 10*3/uL (ref 0.7–4.0)
MCH: 28.4 pg (ref 26.0–34.0)
MCHC: 32.7 g/dL (ref 30.0–36.0)
MCV: 86.9 fL (ref 78.0–100.0)
Monocytes Absolute: 0.8 10*3/uL (ref 0.1–1.0)
Monocytes Relative: 13 %
NEUTROS ABS: 3.5 10*3/uL (ref 1.7–7.7)
NEUTROS PCT: 57 %
PLATELETS: 171 10*3/uL (ref 150–400)
RBC: 5.35 MIL/uL (ref 4.22–5.81)
RDW: 12.4 % (ref 11.5–15.5)
WBC: 6.1 10*3/uL (ref 4.0–10.5)

## 2015-12-13 LAB — SURGICAL PCR SCREEN
MRSA, PCR: NEGATIVE
STAPHYLOCOCCUS AUREUS: POSITIVE — AB

## 2015-12-13 LAB — COMPREHENSIVE METABOLIC PANEL
ALT: 35 U/L (ref 17–63)
AST: 30 U/L (ref 15–41)
Albumin: 3.7 g/dL (ref 3.5–5.0)
Alkaline Phosphatase: 50 U/L (ref 38–126)
Anion gap: 11 (ref 5–15)
BUN: 13 mg/dL (ref 6–20)
CHLORIDE: 106 mmol/L (ref 101–111)
CO2: 27 mmol/L (ref 22–32)
CREATININE: 0.87 mg/dL (ref 0.61–1.24)
Calcium: 9.4 mg/dL (ref 8.9–10.3)
GFR calc Af Amer: >60 mL/min (ref >60)
Glucose, Bld: 98 mg/dL (ref 65–99)
Potassium: 4.4 mmol/L (ref 3.5–5.1)
Sodium: 144 mmol/L (ref 135–145)
Total Bilirubin: 0.4 mg/dL (ref 0.3–1.2)
Total Protein: 6.4 g/dL — ABNORMAL LOW (ref 6.5–8.1)

## 2015-12-13 LAB — URINALYSIS, ROUTINE W REFLEX MICROSCOPIC
BILIRUBIN URINE: NEGATIVE
Glucose, UA: NEGATIVE mg/dL
Hgb urine dipstick: NEGATIVE
KETONES UR: NEGATIVE mg/dL
LEUKOCYTES UA: NEGATIVE
Nitrite: NEGATIVE
PH: 7 (ref 5.0–8.0)
PROTEIN: NEGATIVE mg/dL
Specific Gravity, Urine: 1.013 (ref 1.005–1.030)

## 2015-12-13 LAB — PROTIME-INR
INR: 1.06 (ref 0.00–1.49)
PROTHROMBIN TIME: 14 s (ref 11.6–15.2)

## 2015-12-13 LAB — APTT: aPTT: 24 seconds (ref 24–37)

## 2015-12-13 NOTE — Progress Notes (Signed)
Mr. Cocchiola called and informed of results from pcr screen, and informed of the need to pick up script for Mupirocin. Voices understanding. Script called into [facility].

## 2015-12-13 NOTE — Pre-Procedure Instructions (Signed)
Jose Shea.  12/13/2015      [id number] - [place], [place]  [address] [phone] [phone]    Your procedure is scheduled on May 24  Report to [place] at 1230A.M.  Call this number if you have problems the morning of surgery:  [phone]   Remember:  Do not eat food or drink liquids after midnight.  Take these medicines the morning of surgery with A SIP OF WATER Zyrtec, Flonase, Robaxin if needed, Nadolol (Corgard), Naratriptan (Amerge) if needed, Hydrocodone (norco) if needed,   Stop taking aspirin, BC's, Goody's, Herbal medications, Fish Oil, Aleve, Ibuprofen, Advil, Motrin, Vitamins, Alka- Seltzer   Do not wear jewelry, make-up or nail polish.  Do not wear lotions, powders, or perfumes.  You may wear deodorant.  Do not shave 48 hours prior to surgery.  Men may shave face and neck.  Do not bring valuables to the hospital.  [facility] is not responsible for any belongings or valuables.  Contacts, dentures or bridgework may not be worn into surgery.  Leave your suitcase in the car.  After surgery it may be brought to your room.  For patients admitted to the hospital, discharge time will be determined by your treatment team.  Patients discharged the day of surgery will not be allowed to drive home.    Special instructions:  Jose Shea - Preparing for Surgery  Before surgery, you can play an important role.  Because skin is not sterile, your skin needs to be as free of germs as possible.  You can reduce the number of germs on you skin by washing with CHG (chlorahexidine gluconate) soap before surgery.  CHG is an antiseptic cleaner which kills germs and bonds with the skin to continue killing germs even after washing.  Please DO NOT use if you have an allergy to CHG or antibacterial soaps.  If your skin becomes reddened/irritated stop using the CHG and inform your nurse  when you arrive at Short Stay.  Do not shave (including legs and underarms) for at least 48 hours prior to the first CHG shower.  You may shave your face.  Please follow these instructions carefully:   1.  Shower with CHG Soap the night before surgery and the  morning of Surgery.  2.  If you choose to wash your hair, wash your hair first as usual with your       normal shampoo.  3.  After you shampoo, rinse your hair and body thoroughly to remove the                      Shampoo.  4.  Use CHG as you would any other liquid soap.  You can apply chg directly       to the skin and wash gently with scrungie or a clean washcloth.  5.  Apply the CHG Soap to your body ONLY FROM THE NECK DOWN.        Do not use on open wounds or open sores.  Avoid contact with your eyes,       ears, mouth and genitals (private parts).  Wash genitals (private parts)       with your normal soap.  6.  Wash thoroughly, paying special attention to the area where your surgery        will be performed.  7.  Thoroughly rinse your body with  warm water from the neck down.  8.  DO NOT shower/wash with your normal soap after using and rinsing off       the CHG Soap.  9.  Pat yourself dry with a clean towel.            10.  Wear clean pajamas.            11.  Place clean sheets on your bed the night of your first shower and do not        sleep with pets.  Day of Surgery  Do not apply any lotions/deoderants the morning of surgery.  Please wear clean clothes to the hospital/surgery center.     Please read over the following fact sheets that you were given. Pain Booklet, Coughing and Deep Breathing, MRSA Information and Surgical Site Infection Prevention

## 2015-12-13 NOTE — Progress Notes (Addendum)
PCP is Dr. Georges Lynch  Denies ever seeing a cardiologist. Denies ever having a card cath, stress test, or echo

## 2015-12-13 NOTE — Telephone Encounter (Signed)
Left message on voicemail that paperwork was ready to be picked up.

## 2015-12-16 NOTE — H&P (Signed)
PREOPERATIVE H&P  Chief Complaint: R leg pain  HPI: Jose Shea. is a 60 y.o. male who presents with ongoing pain in the right leg x 9 months  MRI reveals severe stenosis at L4/5, R > L  Patient has failed multiple forms of conservative care and continues to have pain (see office notes for additional details regarding the patient's full course of treatment)  Past Medical History  Diagnosis Date  . Restless leg   . Hypertension   . Chronic headaches   . Pneumonia   . PONV (postoperative nausea and vomiting)   . GERD (gastroesophageal reflux disease)   . Blood dyscrasia     Thrombocytopenia from taking  a medication   Past Surgical History  Procedure Laterality Date  . Hernia repair    . Nasal sinus surgery  2005  . Colonoscopy     Social History   Social History  . Marital Status: Single    Spouse Name: N/A  . Number of Children: N/A  . Years of Education: N/A   Social History Main Topics  . Smoking status: Never Smoker   . Smokeless tobacco: Not on file  . Alcohol Use: 8.4 oz/week    7 Cans of beer, 7 Shots of liquor per week     Comment: one drink a day  . Drug Use: No  . Sexual Activity: Not on file   Other Topics Concern  . Not on file   Social History Narrative   No family history on file. Allergies  Allergen Reactions  . Fluoxetine Hcl     thrombocytopenia  . Quinine     thrombocytopenia   Prior to Admission medications   Medication Sig Start Date End Date Taking? Authorizing Provider  aspirin (ASPIR-LOW) 81 MG EC tablet Take 81 mg by mouth daily.     Yes Historical Provider, MD  aspirin-sod bicarb-citric acid (ALKA-SELTZER) 325 MG TBEF tablet Take 325 mg by mouth every 6 (six) hours as needed.   Yes Historical Provider, MD  cetirizine (ZYRTEC) 10 MG tablet Take 10 mg by mouth daily.   Yes Historical Provider, MD  doxepin (SINEQUAN) 50 MG capsule TAKE 1 CAPSULE (50 MG TOTAL) BY MOUTH AT BEDTIME. 05/14/15  Yes Elberta Leatherwood, MD    fluticasone [facility]) 50 MCG/ACT nasal spray USE 2 SPRAYS INTO EACH NOSTRIL DAILY 10/01/15  Yes Elberta Leatherwood, MD  HYDROcodone-acetaminophen (NORCO/VICODIN) 5-325 MG tablet Take 1 tablet by mouth 2 (two) times daily.   Yes Historical Provider, MD  lisinopril (PRINIVIL,ZESTRIL) 10 MG tablet Take 1 tablet (10 mg total) by mouth daily. 05/14/15  Yes Elberta Leatherwood, MD  Menthol (ICY HOT) 5 % PTCH Apply 1 patch topically daily as needed (back pain).   Yes Historical Provider, MD  methocarbamol (ROBAXIN) 500 MG tablet TAKE 1 TABLET BY MOUTH AT BEDTIME AS NEEDED FOR MUSCLE SPASMS 08/27/15  Yes Elberta Leatherwood, MD  Multiple Vitamin (MULTIVITAMIN WITH MINERALS) TABS tablet Take 1 tablet by mouth daily.   Yes Historical Provider, MD  nadolol (CORGARD) 80 MG tablet TAKE 2 TABLETS (160 MG TOTAL) BY MOUTH DAILY. 07/09/15  Yes Elberta Leatherwood, MD  naratriptan (AMERGE) 2.5 MG tablet TAKE ONE TABLET AT ONSET OF HEADACHE IF RETURNS OR DOES NOT RESOLVE, MAY REPEAT AFTER 4 HOURS 10/18/14  Yes Jayce G Cook, DO  rOPINIRole (REQUIP) 0.25 MG tablet TAKE 1 TABLET BY MOUTH 1-2 HOURS BEFORE BEDTIME FOR RESTLESS LEG SYNDROME AS NEEDED 08/27/15  Yes Elberta Leatherwood, MD  tetrahydrozoline 0.05 % ophthalmic solution Place 1 drop into both eyes daily as needed (dry eye).   Yes Historical Provider, MD     All other systems have been reviewed and were otherwise negative with the exception of those mentioned in the HPI and as above.  Physical Exam: There were no vitals filed for this visit.  General: Alert, no acute distress Cardiovascular: No pedal edema Respiratory: No cyanosis, no use of accessory musculature Skin: No lesions in the area of chief complaint Neurologic: Sensation intact distally Psychiatric: Patient is competent for consent with normal mood and affect Lymphatic: No axillary or cervical lymphadenopathy  MUSCULOSKELETAL: + SLR on the right at L4/5, trace weakness on the right to DF and EHL  Assessment/Plan: Right leg  pain  Plan for Procedure(s): LUMBAR 4-5 DECOMPRESSION   Sinclair Ship, MD 12/16/2015 8:51 AM

## 2015-12-17 MED ORDER — CEFAZOLIN SODIUM-DEXTROSE 2-4 GM/100ML-% IV SOLN
2.0000 g | INTRAVENOUS | Status: AC
  Administered 2015-12-18: 2 g via INTRAVENOUS
  Filled 2015-12-17: qty 100

## 2015-12-18 ENCOUNTER — Ambulatory Visit (HOSPITAL_COMMUNITY): Payer: BLUE CROSS/BLUE SHIELD | Admitting: Anesthesiology

## 2015-12-18 ENCOUNTER — Encounter (HOSPITAL_COMMUNITY)
Admission: RE | Disposition: A | Discharge status: Home / Self Care | Payer: Self-pay | Source: Ambulatory Visit | Attending: Orthopedic Surgery

## 2015-12-18 ENCOUNTER — Ambulatory Visit (HOSPITAL_COMMUNITY)
Admission: RE | Admit: 2015-12-18 | Discharge: 2015-12-18 | Disposition: A | Discharge status: Home / Self Care | Payer: BLUE CROSS/BLUE SHIELD | Source: Ambulatory Visit | Attending: Orthopedic Surgery | Admitting: Orthopedic Surgery

## 2015-12-18 ENCOUNTER — Ambulatory Visit (HOSPITAL_COMMUNITY): Payer: BLUE CROSS/BLUE SHIELD

## 2015-12-18 ENCOUNTER — Encounter (HOSPITAL_COMMUNITY): Payer: Self-pay | Admitting: Anesthesiology

## 2015-12-18 DIAGNOSIS — E785 Hyperlipidemia, unspecified: Secondary | ICD-10-CM | POA: Insufficient documentation

## 2015-12-18 DIAGNOSIS — M1990 Unspecified osteoarthritis, unspecified site: Secondary | ICD-10-CM | POA: Insufficient documentation

## 2015-12-18 DIAGNOSIS — K219 Gastro-esophageal reflux disease without esophagitis: Secondary | ICD-10-CM | POA: Insufficient documentation

## 2015-12-18 DIAGNOSIS — D696 Thrombocytopenia, unspecified: Secondary | ICD-10-CM | POA: Diagnosis not present

## 2015-12-18 DIAGNOSIS — R51 Headache: Secondary | ICD-10-CM | POA: Insufficient documentation

## 2015-12-18 DIAGNOSIS — Z888 Allergy status to other drugs, medicaments and biological substances status: Secondary | ICD-10-CM | POA: Diagnosis not present

## 2015-12-18 DIAGNOSIS — G2581 Restless legs syndrome: Secondary | ICD-10-CM | POA: Insufficient documentation

## 2015-12-18 DIAGNOSIS — M4806 Spinal stenosis, lumbar region: Secondary | ICD-10-CM | POA: Diagnosis present

## 2015-12-18 DIAGNOSIS — Z7982 Long term (current) use of aspirin: Secondary | ICD-10-CM | POA: Diagnosis not present

## 2015-12-18 DIAGNOSIS — Z01818 Encounter for other preprocedural examination: Secondary | ICD-10-CM

## 2015-12-18 DIAGNOSIS — R531 Weakness: Secondary | ICD-10-CM | POA: Diagnosis not present

## 2015-12-18 DIAGNOSIS — Z419 Encounter for procedure for purposes other than remedying health state, unspecified: Secondary | ICD-10-CM

## 2015-12-18 DIAGNOSIS — M5416 Radiculopathy, lumbar region: Secondary | ICD-10-CM | POA: Diagnosis not present

## 2015-12-18 DIAGNOSIS — Z79899 Other long term (current) drug therapy: Secondary | ICD-10-CM | POA: Insufficient documentation

## 2015-12-18 DIAGNOSIS — I10 Essential (primary) hypertension: Secondary | ICD-10-CM | POA: Diagnosis not present

## 2015-12-18 HISTORY — PX: LUMBAR LAMINECTOMY/DECOMPRESSION MICRODISCECTOMY: SHX5026

## 2015-12-18 SURGERY — LUMBAR LAMINECTOMY/DECOMPRESSION MICRODISCECTOMY
Anesthesia: General | Site: Back

## 2015-12-18 MED ORDER — ROCURONIUM BROMIDE 100 MG/10ML IV SOLN
INTRAVENOUS | Status: DC | PRN
  Administered 2015-12-18: 40 mg via INTRAVENOUS

## 2015-12-18 MED ORDER — BUPIVACAINE-EPINEPHRINE (PF) 0.25% -1:200000 IJ SOLN
INTRAMUSCULAR | Status: AC
  Filled 2015-12-18: qty 30

## 2015-12-18 MED ORDER — DIAZEPAM 5 MG PO TABS
ORAL_TABLET | ORAL | Status: AC
  Administered 2015-12-18: 5 mg
  Filled 2015-12-18: qty 1

## 2015-12-18 MED ORDER — LIDOCAINE HCL (CARDIAC) 20 MG/ML IV SOLN
INTRAVENOUS | Status: DC | PRN
  Administered 2015-12-18: 80 mg via INTRAVENOUS

## 2015-12-18 MED ORDER — ACETAMINOPHEN 10 MG/ML IV SOLN
1000.0000 mg | INTRAVENOUS | Status: AC
  Administered 2015-12-18: 1000 mg via INTRAVENOUS
  Filled 2015-12-18: qty 100

## 2015-12-18 MED ORDER — ONDANSETRON HCL 4 MG/2ML IJ SOLN
INTRAMUSCULAR | Status: AC
  Filled 2015-12-18: qty 2

## 2015-12-18 MED ORDER — THROMBIN 20000 UNITS EX SOLR
CUTANEOUS | Status: AC
  Filled 2015-12-18: qty 20000

## 2015-12-18 MED ORDER — METHYLPREDNISOLONE ACETATE 40 MG/ML IJ SUSP
INTRAMUSCULAR | Status: DC | PRN
  Administered 2015-12-18: 40 mg via INTRA_ARTICULAR

## 2015-12-18 MED ORDER — MIDAZOLAM HCL 2 MG/2ML IJ SOLN
INTRAMUSCULAR | Status: AC
  Filled 2015-12-18: qty 2

## 2015-12-18 MED ORDER — DEXAMETHASONE SODIUM PHOSPHATE 4 MG/ML IJ SOLN
INTRAMUSCULAR | Status: DC | PRN
  Administered 2015-12-18: 4 mg via INTRAVENOUS

## 2015-12-18 MED ORDER — METHYLPREDNISOLONE ACETATE 40 MG/ML IJ SUSP
INTRAMUSCULAR | Status: AC
  Filled 2015-12-18: qty 1

## 2015-12-18 MED ORDER — THROMBIN 20000 UNITS EX SOLR
CUTANEOUS | Status: DC | PRN
  Administered 2015-12-18: 20 mL via TOPICAL

## 2015-12-18 MED ORDER — FENTANYL CITRATE (PF) 100 MCG/2ML IJ SOLN
INTRAMUSCULAR | Status: DC | PRN
  Administered 2015-12-18: 150 ug via INTRAVENOUS
  Administered 2015-12-18 (×2): 50 ug via INTRAVENOUS

## 2015-12-18 MED ORDER — SUGAMMADEX SODIUM 200 MG/2ML IV SOLN
INTRAVENOUS | Status: DC | PRN
  Administered 2015-12-18: 150 mg via INTRAVENOUS

## 2015-12-18 MED ORDER — HYDROMORPHONE HCL 1 MG/ML IJ SOLN
INTRAMUSCULAR | Status: AC
  Administered 2015-12-18: 0.5 mg via INTRAVENOUS
  Filled 2015-12-18: qty 1

## 2015-12-18 MED ORDER — FENTANYL CITRATE (PF) 250 MCG/5ML IJ SOLN
INTRAMUSCULAR | Status: AC
  Filled 2015-12-18: qty 5

## 2015-12-18 MED ORDER — SCOPOLAMINE 1 MG/3DAYS TD PT72
1.0000 | MEDICATED_PATCH | TRANSDERMAL | Status: DC
  Administered 2015-12-18: 1.5 mg via TRANSDERMAL
  Filled 2015-12-18: qty 1

## 2015-12-18 MED ORDER — MEPERIDINE HCL 25 MG/ML IJ SOLN: 6.2500 mg | INTRAMUSCULAR | Status: DC | PRN

## 2015-12-18 MED ORDER — ROCURONIUM BROMIDE 50 MG/5ML IV SOLN
INTRAVENOUS | Status: AC
  Filled 2015-12-18: qty 1

## 2015-12-18 MED ORDER — MIDAZOLAM HCL 5 MG/5ML IJ SOLN
INTRAMUSCULAR | Status: DC | PRN
  Administered 2015-12-18: 2 mg via INTRAVENOUS

## 2015-12-18 MED ORDER — GLYCOPYRROLATE 0.2 MG/ML IV SOSY
PREFILLED_SYRINGE | INTRAVENOUS | Status: AC
  Filled 2015-12-18: qty 3

## 2015-12-18 MED ORDER — 0.9 % SODIUM CHLORIDE (POUR BTL) OPTIME
TOPICAL | Status: DC | PRN
  Administered 2015-12-18 (×2): 1000 mL

## 2015-12-18 MED ORDER — PROPOFOL 10 MG/ML IV BOLUS
INTRAVENOUS | Status: AC
  Filled 2015-12-18: qty 20

## 2015-12-18 MED ORDER — LACTATED RINGERS IV SOLN
INTRAVENOUS | Status: DC
  Administered 2015-12-18 (×2): via INTRAVENOUS

## 2015-12-18 MED ORDER — METHYLENE BLUE 0.5 % INJ SOLN
INTRAVENOUS | Status: AC
  Filled 2015-12-18: qty 10

## 2015-12-18 MED ORDER — DEXAMETHASONE SODIUM PHOSPHATE 10 MG/ML IJ SOLN
INTRAMUSCULAR | Status: AC
  Filled 2015-12-18: qty 1

## 2015-12-18 MED ORDER — ONDANSETRON HCL 4 MG/2ML IJ SOLN: 4.0000 mg | Freq: Once | INTRAMUSCULAR | Status: DC | PRN

## 2015-12-18 MED ORDER — ONDANSETRON HCL 4 MG/2ML IJ SOLN
INTRAMUSCULAR | Status: DC | PRN
  Administered 2015-12-18: 4 mg via INTRAVENOUS

## 2015-12-18 MED ORDER — LIDOCAINE 2% (20 MG/ML) 5 ML SYRINGE
INTRAMUSCULAR | Status: AC
  Filled 2015-12-18: qty 5

## 2015-12-18 MED ORDER — SUGAMMADEX SODIUM 200 MG/2ML IV SOLN
INTRAVENOUS | Status: AC
  Filled 2015-12-18: qty 2

## 2015-12-18 MED ORDER — PROPOFOL 10 MG/ML IV BOLUS
INTRAVENOUS | Status: DC | PRN
  Administered 2015-12-18: 130 mg via INTRAVENOUS

## 2015-12-18 MED ORDER — GLYCOPYRROLATE 0.2 MG/ML IJ SOLN
INTRAMUSCULAR | Status: DC | PRN
  Administered 2015-12-18: 0.2 mg via INTRAVENOUS

## 2015-12-18 MED ORDER — HYDROMORPHONE HCL 1 MG/ML IJ SOLN
0.2500 mg | INTRAMUSCULAR | Status: DC | PRN
  Administered 2015-12-18: 0.5 mg via INTRAVENOUS

## 2015-12-18 MED ORDER — POVIDONE-IODINE 7.5 % EX SOLN: Freq: Once | CUTANEOUS | Status: DC

## 2015-12-18 MED ORDER — BUPIVACAINE-EPINEPHRINE 0.25% -1:200000 IJ SOLN
INTRAMUSCULAR | Status: DC | PRN
  Administered 2015-12-18: 4 mL
  Administered 2015-12-18: 20 mL

## 2015-12-18 SURGICAL SUPPLY — 68 items
APL SKNCLS STERI-STRIP NONHPOA (GAUZE/BANDAGES/DRESSINGS) ×1
BENZOIN TINCTURE PRP APPL 2/3 (GAUZE/BANDAGES/DRESSINGS) ×2 IMPLANT
BUR ROUND PRECISION 4.0 (BURR) ×2 IMPLANT
CANISTER SUCTION 2500CC (MISCELLANEOUS) ×2 IMPLANT
CARTRIDGE OIL MAESTRO DRILL (MISCELLANEOUS) ×1 IMPLANT
CORDS BIPOLAR (ELECTRODE) ×2 IMPLANT
COVER SURGICAL LIGHT HANDLE (MISCELLANEOUS) ×2 IMPLANT
DIFFUSER DRILL AIR PNEUMATIC (MISCELLANEOUS) ×2 IMPLANT
DRAIN CHANNEL 15F RND FF W/TCR (WOUND CARE) IMPLANT
DRAPE POUCH INSTRU U-SHP 10X18 (DRAPES) ×4 IMPLANT
DRAPE SURG 17X23 STRL (DRAPES) ×8 IMPLANT
DURAPREP 26ML APPLICATOR (WOUND CARE) ×2 IMPLANT
ELECT BLADE 4.0 EZ CLEAN MEGAD (MISCELLANEOUS) ×2
ELECT CAUTERY BLADE 6.4 (BLADE) ×2 IMPLANT
ELECT REM PT RETURN 9FT ADLT (ELECTROSURGICAL) ×2
ELECTRODE BLDE 4.0 EZ CLN MEGD (MISCELLANEOUS) ×1 IMPLANT
ELECTRODE REM PT RTRN 9FT ADLT (ELECTROSURGICAL) ×1 IMPLANT
EVACUATOR SILICONE 100CC (DRAIN) IMPLANT
FILTER STRAW FLUID ASPIR (MISCELLANEOUS) ×2 IMPLANT
GAUZE SPONGE 4X4 12PLY STRL (GAUZE/BANDAGES/DRESSINGS) ×2 IMPLANT
GAUZE SPONGE 4X4 16PLY XRAY LF (GAUZE/BANDAGES/DRESSINGS) ×4 IMPLANT
GLOVE BIO SURGEON STRL SZ7 (GLOVE) ×2 IMPLANT
GLOVE BIO SURGEON STRL SZ8 (GLOVE) ×2 IMPLANT
GLOVE BIOGEL PI IND STRL 7.0 (GLOVE) ×1 IMPLANT
GLOVE BIOGEL PI IND STRL 8 (GLOVE) ×1 IMPLANT
GLOVE BIOGEL PI INDICATOR 7.0 (GLOVE) ×1
GLOVE BIOGEL PI INDICATOR 8 (GLOVE) ×1
GOWN STRL REUS W/ TWL LRG LVL3 (GOWN DISPOSABLE) ×1 IMPLANT
GOWN STRL REUS W/ TWL XL LVL3 (GOWN DISPOSABLE) ×2 IMPLANT
GOWN STRL REUS W/TWL LRG LVL3 (GOWN DISPOSABLE) ×2
GOWN STRL REUS W/TWL XL LVL3 (GOWN DISPOSABLE) ×4
IV CATH 14GX2 1/4 (CATHETERS) ×2 IMPLANT
KIT BASIN OR (CUSTOM PROCEDURE TRAY) ×2 IMPLANT
KIT POSITION SURG JACKSON T1 (MISCELLANEOUS) ×2 IMPLANT
KIT ROOM TURNOVER OR (KITS) ×2 IMPLANT
NEEDLE 18GX1X1/2 (RX/OR ONLY) (NEEDLE) ×2 IMPLANT
NEEDLE 22X1 1/2 (OR ONLY) (NEEDLE) ×2 IMPLANT
NEEDLE HYPO 25GX1X1/2 BEV (NEEDLE) ×2 IMPLANT
NEEDLE SPNL 18GX3.5 QUINCKE PK (NEEDLE) ×4 IMPLANT
NS IRRIG 1000ML POUR BTL (IV SOLUTION) ×2 IMPLANT
OIL CARTRIDGE MAESTRO DRILL (MISCELLANEOUS) ×2
PACK LAMINECTOMY ORTHO (CUSTOM PROCEDURE TRAY) ×2 IMPLANT
PACK UNIVERSAL I (CUSTOM PROCEDURE TRAY) ×2 IMPLANT
PAD ARMBOARD 7.5X6 YLW CONV (MISCELLANEOUS) ×4 IMPLANT
PATTIES SURGICAL .5 X.5 (GAUZE/BANDAGES/DRESSINGS) IMPLANT
PATTIES SURGICAL .5 X1 (DISPOSABLE) ×2 IMPLANT
SPONGE INTESTINAL PEANUT (DISPOSABLE) ×2 IMPLANT
SPONGE SURGIFOAM ABS GEL 100 (HEMOSTASIS) ×2 IMPLANT
SPONGE SURGIFOAM ABS GEL SZ50 (HEMOSTASIS) ×2 IMPLANT
STRIP CLOSURE SKIN 1/2X4 (GAUZE/BANDAGES/DRESSINGS) ×2 IMPLANT
SURGIFLO W/THROMBIN 8M KIT (HEMOSTASIS) IMPLANT
SUT MNCRL AB 4-0 PS2 18 (SUTURE) ×2 IMPLANT
SUT VIC AB 0 CT1 18XCR BRD 8 (SUTURE) IMPLANT
SUT VIC AB 0 CT1 27 (SUTURE)
SUT VIC AB 0 CT1 27XBRD ANBCTR (SUTURE) IMPLANT
SUT VIC AB 0 CT1 8-18 (SUTURE)
SUT VIC AB 1 CT1 18XCR BRD 8 (SUTURE) ×1 IMPLANT
SUT VIC AB 1 CT1 8-18 (SUTURE) ×2
SUT VIC AB 2-0 CT2 18 VCP726D (SUTURE) ×2 IMPLANT
SYR 20CC LL (SYRINGE) ×2 IMPLANT
SYR BULB IRRIGATION 50ML (SYRINGE) ×2 IMPLANT
SYR CONTROL 10ML LL (SYRINGE) ×4 IMPLANT
SYR TB 1ML 26GX3/8 SAFETY (SYRINGE) ×4 IMPLANT
SYR TB 1ML LUER SLIP (SYRINGE) ×4 IMPLANT
TOWEL OR 17X24 6PK STRL BLUE (TOWEL DISPOSABLE) ×2 IMPLANT
TOWEL OR 17X26 10 PK STRL BLUE (TOWEL DISPOSABLE) ×2 IMPLANT
WATER STERILE IRR 1000ML POUR (IV SOLUTION) ×2 IMPLANT
YANKAUER SUCT BULB TIP NO VENT (SUCTIONS) ×2 IMPLANT

## 2015-12-18 NOTE — Transfer of Care (Signed)
Immediate Anesthesia Transfer of Care Note  Patient: Jose Shea.  Procedure(s) Performed: Procedure(s) with comments: LUMBAR 4-5 DECOMPRESSION (N/A) - LUMBAR 4-5 DECOMPRESSION  Patient Location: PACU  Anesthesia Type:General  Level of Consciousness: awake, oriented and patient cooperative  Airway & Oxygen Therapy: Patient Spontanous Breathing and Patient connected to nasal cannula oxygen  Post-op Assessment: Report given to RN and Post -op Vital signs reviewed and stable  Post vital signs: Reviewed  Last Vitals:  Filed Vitals:   12/18/15 1041  BP: 132/93  Pulse: 60  Temp: 37.1 C  Resp: 20    Last Pain: There were no vitals filed for this visit.    Patients Stated Pain Goal: 2 ([id number])  Complications: No apparent anesthesia complications

## 2015-12-18 NOTE — Anesthesia Postprocedure Evaluation (Signed)
Anesthesia Post Note  Patient: Jose Shea.  Procedure(s) Performed: Procedure(s) (LRB): LUMBAR 4-5 DECOMPRESSION (N/A)  Patient location during evaluation: PACU Anesthesia Type: General Level of consciousness: sedated and patient cooperative Pain management: pain level controlled Vital Signs Assessment: post-procedure vital signs reviewed and stable Respiratory status: spontaneous breathing Cardiovascular status: stable Anesthetic complications: no    Last Vitals:  Filed Vitals:   12/18/15 1800 12/18/15 1815  BP: 117/79   Pulse: 68 76  Temp: 36.6 C   Resp: 12 17    Last Pain:  Filed Vitals:   12/18/15 1815  PainSc: [place]

## 2015-12-18 NOTE — Anesthesia Procedure Notes (Signed)
Procedure Name: Intubation Date/Time: 12/18/2015 2:15 PM Performed by: Jenne Campus Pre-anesthesia Checklist: Patient identified, Emergency Drugs available, Suction available, Patient being monitored and Timeout performed Patient Re-evaluated:Patient Re-evaluated prior to inductionOxygen Delivery Method: Circle system utilized Preoxygenation: Pre-oxygenation with 100% oxygen Intubation Type: IV induction Ventilation: Mask ventilation without difficulty Laryngoscope Size: Miller and 2 Grade View: Grade I Tube type: Oral Tube size: 7.5 mm Number of attempts: 1 Airway Equipment and Method: Stylet Placement Confirmation: positive ETCO2,  ETT inserted through vocal cords under direct vision and breath sounds checked- equal and bilateral Secured at: 21 cm Tube secured with: Tape Dental Injury: Teeth and Oropharynx as per pre-operative assessment

## 2015-12-18 NOTE — Anesthesia Preprocedure Evaluation (Addendum)
Anesthesia Evaluation  Patient identified by MRN, date of birth, ID band Patient awake    Reviewed: Allergy & Precautions, NPO status , Patient's Chart, lab work & pertinent test results, reviewed documented beta blocker date and time   History of Anesthesia Complications (+) PONV and history of anesthetic complications  Airway Mallampati: II  TM Distance: >3 FB Neck ROM: Full    Dental no notable dental hx. (+) Teeth Intact, Caps, Dental Advisory Given   Pulmonary pneumonia, resolved,    Pulmonary exam normal breath sounds clear to auscultation       Cardiovascular hypertension, Pt. on medications Normal cardiovascular exam Rhythm:Regular Rate:Normal     Neuro/Psych  Headaches, Restless legs syndrome negative psych ROS   GI/Hepatic Neg liver ROS, GERD  Medicated and Controlled,  Endo/Other  Hyperlipidemia  Renal/GU negative Renal ROS  negative genitourinary   Musculoskeletal  (+) Arthritis , Osteoarthritis,  HNP L4-5   Abdominal   Peds  Hematology  (+) Blood dyscrasia, , Hx/o drug induced thrombocytopenia- recovered   Anesthesia Other Findings   Reproductive/Obstetrics                         Anesthesia Physical Anesthesia Plan  ASA: II  Anesthesia Plan: General   Post-op Pain Management:    Induction: Intravenous  Airway Management Planned: Oral ETT  Additional Equipment:   Intra-op Plan:   Post-operative Plan: Extubation in OR  Informed Consent: I have reviewed the patients History and Physical, chart, labs and discussed the procedure including the risks, benefits and alternatives for the proposed anesthesia with the patient or authorized representative who has indicated his/her understanding and acceptance.   Dental advisory given  Plan Discussed with: CRNA and Surgeon  Anesthesia Plan Comments:         Anesthesia Quick Evaluation

## 2015-12-18 NOTE — OR Nursing (Signed)
Discharged instructions were reviewed and all questions answered.

## 2015-12-19 ENCOUNTER — Encounter (HOSPITAL_COMMUNITY): Payer: Self-pay | Admitting: Orthopedic Surgery

## 2015-12-19 NOTE — Op Note (Signed)
NAME:  Jose Shea, Jose Shea NO.:  [id number]  MEDICAL RECORD NO.:  [id number]  LOCATION:                                 FACILITY:  PHYSICIAN:  Phylliss Bob, MD      DATE OF BIRTH:  Oct 01, 1955  DATE OF PROCEDURE:  12/18/2015                              OPERATIVE REPORT   PREOPERATIVE DIAGNOSES: 1. Severe L4-5 spinal stenosis. 2. Right-sided lumbar radiculopathy.  POSTOPERATIVE DIAGNOSES: 1. Severe L4-5 spinal stenosis. 2. Right-sided lumbar radiculopathy.  PROCEDURE:  L4-5 laminectomy with bilateral partial facetectomy and bilateral foraminotomy.  SURGEON:  Phylliss Bob, MD  ASSISTANT:  Pricilla Holm, PA-C  ANESTHESIA:  General endotracheal anesthesia.  COMPLICATIONS:  None.  DISPOSITION:  Stable.  ESTIMATED BLOOD LOSS:  Minimal.  INDICATIONS FOR SURGERY:  Briefly, [name] is a pleasant 60 year old male, who did present to me with ongoing and rather severe pain in the right leg.  The patient's pain was ongoing.  The patient did have epidural injections which did give him temporary relief, but his pain did recur.  Given the patient's ongoing pain and dysfunction, in addition to weakness noted on his exam, we did discuss proceeding with a decompressive procedure.  The patient was fully aware of the risks and limitations of surgery.  OPERATIVE DETAILS:  On Dec 18, 2015, patient was brought to surgery and general endotracheal anesthesia was administered.  The patient was placed prone on a well-padded flat Jackson bed with a Wilson frame. Antibiotics were given.  The back was prepped and draped and a time-out procedure was performed.  A midline incision was then made overlying the L4-5 intervertebral space.  The L4 spinous process was removed.  The L4 and L5 lamina were identified and subperiosteally exposed.  I then performed a central and bilateral lateral recess decompression.  There was significant and rather abundant facet hypertrophy noted  bilaterally spanning the L4-5 intervertebral space.  Every aspect of the ligamentum flavum hypertrophy and nerve compression was removed.  The bilateral L5 nerves were noted to be rather swollen and erythematous, consistent with nerve inflammation.  I was however able to adequately decompress the bilateral L5 nerves.  The wound was then copiously irrigated.  A 40 mg of Depo-Medrol was then introduced about the epidural space.  At the termination of the procedure, there was no extravasation of cerebrospinal fluid and there was no significant bleeding noted.  I then closed the wound in layers.  The fascia was closed using #1 Vicryl.  The subcutaneous layer was closed using 2-0 Vicryl and the skin was closed using 3-0 Monocryl.  Benzoin and Steri-Strips were applied followed by sterile dressing.  All instrument counts were correct at the termination of the procedure.  Of note, Pricilla Holm was my assistant throughout surgery, and did aid in retraction, suctioning, and closure from start to finish.     Phylliss Bob, MD   ______________________________ Phylliss Bob, MD    MD/MEDQ  D:  12/18/2015  T:  12/18/2015  Job:  [id number]

## 2015-12-26 ENCOUNTER — Other Ambulatory Visit: Payer: Self-pay | Admitting: Family Medicine

## 2016-02-23 ENCOUNTER — Other Ambulatory Visit: Payer: Self-pay | Admitting: Family Medicine

## 2016-02-25 ENCOUNTER — Other Ambulatory Visit: Payer: Self-pay | Admitting: Family Medicine

## 2016-02-25 ENCOUNTER — Other Ambulatory Visit: Payer: Self-pay | Admitting: *Deleted

## 2016-02-26 ENCOUNTER — Ambulatory Visit (INDEPENDENT_AMBULATORY_CARE_PROVIDER_SITE_OTHER): Payer: BLUE CROSS/BLUE SHIELD | Admitting: Family Medicine

## 2016-02-26 ENCOUNTER — Encounter: Payer: Self-pay | Admitting: Family Medicine

## 2016-02-26 DIAGNOSIS — R1084 Generalized abdominal pain: Secondary | ICD-10-CM | POA: Diagnosis not present

## 2016-02-26 DIAGNOSIS — R109 Unspecified abdominal pain: Secondary | ICD-10-CM | POA: Insufficient documentation

## 2016-02-26 DIAGNOSIS — M545 Low back pain, unspecified: Secondary | ICD-10-CM

## 2016-02-26 DIAGNOSIS — I10 Essential (primary) hypertension: Secondary | ICD-10-CM | POA: Diagnosis not present

## 2016-02-26 DIAGNOSIS — M79604 Pain in right leg: Secondary | ICD-10-CM

## 2016-02-26 MED ORDER — LISINOPRIL 20 MG PO TABS: 20.0000 mg | ORAL_TABLET | Freq: Every day | ORAL | 3 refills | Status: DC

## 2016-02-26 MED ORDER — NARATRIPTAN HCL 2.5 MG PO TABS: ORAL_TABLET | ORAL | 3 refills | Status: DC

## 2016-02-26 NOTE — Patient Instructions (Signed)
It was a pleasure seeing you today in our clinic. Today we discussed your recent surgery and blood pressure. Here is the treatment plan we have discussed and agreed upon together:   - Today I increased your blood pressure medication. I would like you to take 20 mg of lisinopril every day. Take 2 tablets of your current lisinopril medication until the bottles empty. When you go to get a refill on this prescription your new tablets will be 20 mg and you only need to take 1 tablet a day. - I'm very pleased with your recovery has been going. Continue the hard work with physical therapy. Allow pain to be your guide. - If you have any questions or setbacks do not hesitate to call our office. - Follow-up with me in 3-6 months or sooner if needed.

## 2016-02-26 NOTE — Assessment & Plan Note (Signed)
Generalized abdominal pain likely secondary to viral enteritis versus food related abdominal cramping. Symptoms significantly improved today. - Conservative therapy at this time - Encourage staying well hydrated with water.

## 2016-02-26 NOTE — Assessment & Plan Note (Signed)
Worse: Currently not a goal. Patient is very interested in increasing ACE inhibitor at this time. He endorses good compliance with medications. - Increase lisinopril from 10 mg daily to 20 mg daily. - Monitor

## 2016-02-26 NOTE — Assessment & Plan Note (Signed)
Improved:  Status post surgical decompression of the lumbar spine at the level L4-5. Patient is very pleased with his current improvement. Still undergoing physical therapy at this time. - Monitor.

## 2016-02-26 NOTE — Progress Notes (Signed)
   HPI  CC: Medication refill. Patient is here for medication refill. He states he is doing well overall. Earlier this week he was having some abdominal discomfort with associated diarrhea but that has since improved and he is currently not too worried about this. Patient recently had surgical decompression of his lumbar spine. He has been recovering well since and is currently undergoing physical therapy. He is very pleased with his results so far and has significant improvement in the pain within his right leg.  Patient endorses good compliance with all his medications. He is aware that his blood pressure is slightly elevated today and he is very willing to increase the dose of his ACE inhibitor at this time. He denies any symptoms of elevated hypertension like new headaches, vomiting, dizziness, blurred vision, shortness of breath, chest pain.   Review of Systems   See HPI for ROS. All other systems reviewed and are negative.  CC, SH/smoking status, and VS noted  Objective: BP (!) 161/89 (BP Location: Left Arm, Patient Position: Sitting)   Pulse (!) 57   Temp 97.9 F (36.6 C) (Oral)   Ht 5' 8.5" (1.74 m)   Wt 151 lb 6.4 oz (68.7 kg)   BMI 22.69 kg/m  Gen: NAD, alert, cooperative, and pleasant. CV: RRR, no murmur Resp: CTAB, no wheezes, non-labored Abd: SNTND, BS present, no guarding or organomegaly Ext: No edema, warm Neuro: Alert and oriented, Speech clear, No gross deficits. DTRs +1 bilaterally  Assessment and plan:  HYPERTENSION, BENIGN SYSTEMIC Worse: Currently not a goal. Patient is very interested in increasing ACE inhibitor at this time. He endorses good compliance with medications. - Increase lisinopril from 10 mg daily to 20 mg daily. - Monitor  Low back pain radiating to right leg Improved:  Status post surgical decompression of the lumbar spine at the level L4-5. Patient is very pleased with his current improvement. Still undergoing physical therapy at this  time. - Monitor.  Abdominal pain Generalized abdominal pain likely secondary to viral enteritis versus food related abdominal cramping. Symptoms significantly improved today. - Conservative therapy at this time - Encourage staying well hydrated with water.   Meds ordered this encounter  Medications  . lisinopril (PRINIVIL,ZESTRIL) 20 MG tablet    Sig: Take 1 tablet (20 mg total) by mouth daily.    Dispense:  90 tablet    Refill:  3     Elberta Leatherwood, MD,MS,  PGY3 02/26/2016 6:02 PM

## 2016-03-10 ENCOUNTER — Other Ambulatory Visit: Payer: Self-pay | Admitting: Family Medicine

## 2016-04-26 ENCOUNTER — Other Ambulatory Visit: Payer: Self-pay | Admitting: Family Medicine

## 2016-04-29 ENCOUNTER — Other Ambulatory Visit: Payer: Self-pay | Admitting: Family Medicine

## 2016-09-27 ENCOUNTER — Other Ambulatory Visit: Payer: Self-pay | Admitting: Family Medicine

## 2016-10-26 ENCOUNTER — Other Ambulatory Visit: Payer: Self-pay | Admitting: *Deleted

## 2016-10-26 NOTE — Telephone Encounter (Signed)
Refill request for 90 day supply.  Martin, Tamika L, RN  

## 2016-10-27 MED ORDER — NADOLOL 80 MG PO TABS: 160.0000 mg | ORAL_TABLET | Freq: Every day | ORAL | 3 refills | Status: DC

## 2016-11-23 ENCOUNTER — Other Ambulatory Visit: Payer: Self-pay | Admitting: *Deleted

## 2016-11-23 MED ORDER — FLUTICASONE PROPIONATE 50 MCG/ACT NA SUSP: NASAL | 6 refills | Status: DC

## 2016-11-23 NOTE — Telephone Encounter (Signed)
Refill request for 90 day supply.  Novaleigh Kohlman L, RN  

## 2016-12-01 ENCOUNTER — Other Ambulatory Visit: Payer: Self-pay | Admitting: *Deleted

## 2016-12-02 MED ORDER — FLUTICASONE PROPIONATE 50 MCG/ACT NA SUSP: NASAL | 6 refills | Status: DC

## 2016-12-04 ENCOUNTER — Ambulatory Visit (INDEPENDENT_AMBULATORY_CARE_PROVIDER_SITE_OTHER): Payer: BLUE CROSS/BLUE SHIELD | Admitting: Family Medicine

## 2016-12-04 DIAGNOSIS — M25519 Pain in unspecified shoulder: Secondary | ICD-10-CM | POA: Insufficient documentation

## 2016-12-04 DIAGNOSIS — M542 Cervicalgia: Secondary | ICD-10-CM

## 2016-12-04 DIAGNOSIS — M7582 Other shoulder lesions, left shoulder: Secondary | ICD-10-CM

## 2016-12-04 MED ORDER — NAPROXEN 500 MG PO TABS: 500.0000 mg | ORAL_TABLET | Freq: Two times a day (BID) | ORAL | 5 refills | Status: DC

## 2016-12-04 MED ORDER — ESOMEPRAZOLE MAGNESIUM 20 MG PO CPDR: 20.0000 mg | DELAYED_RELEASE_CAPSULE | Freq: Every day | ORAL | Status: DC

## 2016-12-04 NOTE — Progress Notes (Signed)
HPI  CC: meds and neck pain Patient is here to go over his medications. He states that he has been well compliant with all of his medications at this time and has no complaints or reports of adverse side effects. He states he feels well overall with the exception of some recent "neck pain". He denies any fever, chills, headache, blurred vision, chest pain, shortness of breath, dysphasia, nausea, vomiting, diarrhea, dysuria, hematochezia, melena, numbness, weakness, or paresthesias.  "Neck pain": States that he has been having more issues/discomfort lately. He had had issues with this type of pain/discomfort in the past. Much of this is gone away a few years ago. Pain is located along the left side at the base of his neck. It seems to radiate occasionally to his mid arm. Infrequently this pain will radiate distal still all the way to his fingertips. He states that hot/warm showers help as well as some occasional Aleve. Prolonged physical activity can flare this up. He denies any significant weakness or prior injury to this shoulder or neck.  Review of Systems See HPI for ROS.   CC, SH/smoking status, and VS noted  Objective: BP 118/86   Pulse (!) 59   Temp 98.7 F (37.1 C) (Oral)   Ht 5\' 9"  (1.753 m)   Wt 156 lb (70.8 kg)   BMI 23.04 kg/m  Gen: NAD, alert, cooperative, and pleasant. HEENT: NCAT, EOMI, PERRL, full ROM CV: RRR, no murmur Resp: CTAB, no wheezes, non-labored Neuro: Alert and oriented, Speech clear, No gross deficits Shoulder, Left: negative evidence of bony deformity, asymmetry, or muscle atrophy; negative for tenderness over long head of biceps (bicipital groove). negative for [facility] Joint tenderness. Full active and passive range of motion (180 flex Huel Cote /150Abd /90ER /70IR), strength 5/5 throughout. IR, ER, and ER@90  within normal limits. Notable pain with IR against resistance. Thumb to L1 with some tenderness (right side was asymptomatic to T10-12). Sensation  intact. Peripheral pulses intact.  Special Tests:   - Crossarm test negative   - Empty can negative   - Hawkins negative   - Yergason's negative  INJECTION: Left Shoulder, Subacromial Patient was given informed consent, signed copy in the chart. Appropriate time out was taken. Area prepped and draped in usual sterile fashion. 1 cc of methylprednisolone 40 mg/ml plus  4 cc of 1% lidocaine without epinephrine was injected into the left subacromial space using a(n) posterior approach. The patient tolerated the procedure well. There were no complications. Post procedure instructions were given.   Assessment and plan:  Rotator cuff tendinitis, left Patient presented with "neck pain". Physical exam and much of his history was consistent with tendinitis of the subscapularis tendon. Differential diagnosis includes cervical radiculopathy (with radiation occasionally to fingers), however physical exam yielded reproducible pain with internal rotation against resistance of the left arm and a slightly reduced range of motion (IR) compared to the right. - Subacromial left-sided shoulder injection provided today. - Continue occasional NSAIDs when necessary - I've asked patient to follow-up in 3-4 weeks if symptoms persist or worsen.  - At that time consideration for additional imaging of his C-spine may be warranted in order to rule in/out radiculopathy.   Meds ordered this encounter  Medications  . esomeprazole (NEXIUM) 20 MG capsule    Sig: Take 1 capsule (20 mg total) by mouth daily at 12 noon.  . naproxen (NAPROSYN) 500 MG tablet    Sig: Take 1 tablet (500 mg total) by mouth 2 (two) times daily  with a meal. For 5 days. Then, twice a day AS NEEDED.    Dispense:  60 tablet    Refill:  5     Elberta Leatherwood, MD,MS,  PGY3 12/04/2016 6:31 PM

## 2016-12-04 NOTE — Patient Instructions (Signed)
It was a pleasure seeing you today in our clinic. Today we discussed your neck and shoulder pain. Here is the treatment plan we have discussed and agreed upon together:   - The steroid from the injection should take 3-5 days to kick in. If you notice any redness or significant tenderness with movement of the shoulder come back in for reevaluation immediately. - Take the prescribed naproxen twice a day for 5 days. Then take this as needed (twice a day) after that. - Continue taking your medications as prescribed.

## 2016-12-04 NOTE — Assessment & Plan Note (Signed)
Patient presented with "neck pain". Physical exam and much of his history was consistent with tendinitis of the subscapularis tendon. Differential diagnosis includes cervical radiculopathy (with radiation occasionally to fingers), however physical exam yielded reproducible pain with internal rotation against resistance of the left arm and a slightly reduced range of motion (IR) compared to the right. - Subacromial left-sided shoulder injection provided today. - Continue occasional NSAIDs when necessary - I've asked patient to follow-up in 3-4 weeks if symptoms persist or worsen.  - At that time consideration for additional imaging of his C-spine may be warranted in order to rule in/out radiculopathy.

## 2016-12-18 ENCOUNTER — Other Ambulatory Visit: Payer: Self-pay | Admitting: Family Medicine

## 2016-12-22 ENCOUNTER — Ambulatory Visit (INDEPENDENT_AMBULATORY_CARE_PROVIDER_SITE_OTHER): Payer: BLUE CROSS/BLUE SHIELD | Admitting: Family Medicine

## 2016-12-22 VITALS — BP 140/92 | HR 54 | Temp 98.3°F | Wt 153.0 lb

## 2016-12-22 DIAGNOSIS — M542 Cervicalgia: Secondary | ICD-10-CM | POA: Diagnosis not present

## 2016-12-22 DIAGNOSIS — M25519 Pain in unspecified shoulder: Secondary | ICD-10-CM

## 2016-12-22 MED ORDER — GABAPENTIN 100 MG PO CAPS: ORAL_CAPSULE | ORAL | 1 refills | Status: DC

## 2016-12-22 NOTE — Assessment & Plan Note (Signed)
Patient is here for follow-up after receiving a left-sided subacromial joint injection for suspected rotator cuff tendinitis. Patient states that he had minimal to no improvement after this injection. Symptoms persist. No red flag symptoms at this time requiring urgent referral. High suspicion for cervical radicular pain. - C-spine x-rays ordered - Gabapentin 100 mg in the morning, 200 mg at night. - We'll contact patient with x-ray results. High likelihood that no significant abnormalities are found on x-ray, and patient may warrant MRI of the C-spine to assess for cervical radiculopathy as patient may benefit from spinal nerve injections if this is found.

## 2016-12-22 NOTE — Patient Instructions (Signed)
It was a pleasure seeing you today in our clinic. Today we discussed your neck pain. Here is the treatment plan we have discussed and agreed upon together:   - I have started you on gabapentin. Take 1 tablet in the morning/afternoon. Take 2 tablets at night/before bed. - I've ordered x-rays of your neck. Please have these done within the next week. - Unless there are significant findings seen on these x-rays we will likely need to get an MRI to further evaluate the structure within your neck. However, we will only know after getting x-rays.

## 2016-12-22 NOTE — Progress Notes (Signed)
   HPI  CC: Left-sided neck pain Patient is here for follow-up on his left-sided neck pain and shoulder pain. At the last visit he received a subacromial steroid injection. Patient states that he had minimal to no relief following this injection. Pain persists along the left base of his neck extending down the left shoulder and lateral aspect of the upper arm. He denies any numbness/weakness in the affected arm. Pain is described as sharp and nagging. He denies any prior injury but has had "issues with my neck" in the past.  Review of Systems See HPI for ROS.   CC, SH/smoking status, and VS noted  Objective: BP (!) 140/92   Pulse (!) 54   Temp 98.3 F (36.8 C) (Oral)   Wt 153 lb (69.4 kg)   BMI 22.59 kg/m  Gen: NAD, alert, cooperative, and pleasant. CV: Well-perfused, capillary refill <2 seconds Resp: non-labored Ext: No edema, warm Neuro: Alert and oriented, Speech clear, No gross deficits, +2 DTRs bilaterally. Neck/Shoulder, Left: negative evidence of bony deformity, asymmetry, or muscle atrophy; Neck FROM. Negative for tenderness over long head of biceps (bicipital groove). negative for [facility] Joint tenderness. Full active and passive range of motion (180 flex Huel Cote /150Abd /90ER /70IR), strength 5/5 throughout. IR, ER, and ER@90  within normal limits. Notable pain with IR against resistance. Thumb to L1 with some tenderness (right side was asymptomatic to T10-12). Sensation intact. Peripheral pulses intact. Spurling's test negative.   Assessment and plan:  Neck and shoulder pain Patient is here for follow-up after receiving a left-sided subacromial joint injection for suspected rotator cuff tendinitis. Patient states that he had minimal to no improvement after this injection. Symptoms persist. No red flag symptoms at this time requiring urgent referral. High suspicion for cervical radicular pain. - C-spine x-rays ordered - Gabapentin 100 mg in the morning, 200 mg at night. -  We'll contact patient with x-ray results. High likelihood that no significant abnormalities are found on x-ray, and patient may warrant MRI of the C-spine to assess for cervical radiculopathy as patient may benefit from spinal nerve injections if this is found.   Orders Placed This Encounter  Procedures  . DG Cervical Spine Complete    Standing Status:   Future    Standing Expiration Date:   02/21/2018    Order Specific Question:   Reason for Exam (SYMPTOM  OR DIAGNOSIS REQUIRED)    Answer:   Left sided neck pain    Order Specific Question:   Preferred imaging location?    Answer:   [facility]    Order Specific Question:   Radiology Contrast Protocol - do NOT remove file path    Answer:   [email or web address]    Meds ordered this encounter  Medications  . gabapentin (NEURONTIN) 100 MG capsule    Sig: Take 1 tablet (100mg ) in the morning. Take 2 tablets (200mg ) at night.    Dispense:  90 capsule    Refill:  1     Elberta Leatherwood, MD,MS,  PGY3 12/22/2016 5:51 PM

## 2016-12-23 ENCOUNTER — Ambulatory Visit
Admission: RE | Admit: 2016-12-23 | Discharge: 2016-12-23 | Disposition: A | Discharge status: Home / Self Care | Payer: BLUE CROSS/BLUE SHIELD | Source: Ambulatory Visit | Attending: Family Medicine | Admitting: Family Medicine

## 2016-12-23 DIAGNOSIS — M542 Cervicalgia: Secondary | ICD-10-CM

## 2016-12-28 ENCOUNTER — Other Ambulatory Visit: Payer: Self-pay | Admitting: Family Medicine

## 2016-12-28 DIAGNOSIS — M542 Cervicalgia: Principal | ICD-10-CM

## 2016-12-28 DIAGNOSIS — M25519 Pain in unspecified shoulder: Secondary | ICD-10-CM

## 2016-12-28 DIAGNOSIS — M5412 Radiculopathy, cervical region: Secondary | ICD-10-CM

## 2016-12-29 NOTE — Progress Notes (Signed)
Patient will be contacted directly by [facility].

## 2016-12-30 ENCOUNTER — Other Ambulatory Visit: Payer: Self-pay | Admitting: Family Medicine

## 2017-01-09 ENCOUNTER — Ambulatory Visit
Admission: RE | Admit: 2017-01-09 | Discharge: 2017-01-09 | Disposition: A | Discharge status: Home / Self Care | Payer: BLUE CROSS/BLUE SHIELD | Source: Ambulatory Visit | Attending: Family Medicine | Admitting: Family Medicine

## 2017-01-09 DIAGNOSIS — M5412 Radiculopathy, cervical region: Secondary | ICD-10-CM

## 2017-01-09 DIAGNOSIS — M25519 Pain in unspecified shoulder: Secondary | ICD-10-CM

## 2017-01-09 DIAGNOSIS — M542 Cervicalgia: Principal | ICD-10-CM

## 2017-01-13 ENCOUNTER — Telehealth: Payer: Self-pay | Admitting: Family Medicine

## 2017-01-13 NOTE — Telephone Encounter (Signed)
Pt would like PCP to call with MRI results, pt did schedule an appointment for Monday, but if it is not needed please call pt at home and let him know. Ok to leave message. ep

## 2017-01-13 NOTE — Telephone Encounter (Signed)
Will forward to PCP and team.  Derl Barrow, RN

## 2017-01-15 NOTE — Telephone Encounter (Signed)
Looks like pt is already scheduled for an appt with you Monday. Deseree Kennon Holter, CMA

## 2017-01-15 NOTE — Telephone Encounter (Signed)
Tried contacting patient. No answer, got VM, but did not leave message.   I think it would be ideal to discuss these results in person. If OV is very inconvenient then we could get around that. I did believe that a referral to orthopaedics would be ideal >> whether patient would like me to do that now, or after we discuss the MRI results is up to him.  Red Team, can someone try to reach this patient later today, please?  Thank you!

## 2017-01-18 ENCOUNTER — Ambulatory Visit (INDEPENDENT_AMBULATORY_CARE_PROVIDER_SITE_OTHER): Payer: BLUE CROSS/BLUE SHIELD | Admitting: Family Medicine

## 2017-01-18 DIAGNOSIS — M25519 Pain in unspecified shoulder: Secondary | ICD-10-CM | POA: Diagnosis not present

## 2017-01-18 DIAGNOSIS — M542 Cervicalgia: Secondary | ICD-10-CM

## 2017-01-18 NOTE — Progress Notes (Signed)
   HPI  CC: Cervical neck pain follow-up, left sided Patient is here to follow-up on his MRI due to persistent and worsening cervical neck pain. Patient states that his pain is at its baseline. No significant worsening since our last visit. He denies any recent injuries. Pain continues to start at the base of his neck and radiate laterally down his arms. He has occasional symptoms of paresthesias. He denies any weakness or numbness at this time. He states that he feels well overall but the pain and paresthesias can be difficult to tolerate some days.  He denies any fevers, chills, headache, vision changes, shortness of breath, chest pain, weakness, numbness, or worsening or more frequent paresthesias.  Review of Systems See HPI for ROS.   CC, SH/smoking status, and VS noted  Objective: BP 118/68   Pulse (!) 53   Temp 98.1 F (36.7 C) (Oral)   Ht 5\' 9"  (1.753 m)   Wt 151 lb 9.6 oz (68.8 kg)   SpO2 97%   BMI 22.39 kg/m  Gen: NAD, alert, cooperative, and pleasant. Resp: non-labored Ext: No edema, warm Neuro: Alert and oriented, Speech clear, No gross deficits, +2 DTRs bilaterally. Neck/Shoulder, Left:negativeevidence of bony deformity, asymmetry, or muscle atrophy; Neck FROM. Negativefor tenderness over long head of biceps (bicipital groove). negativefor AC Joint tenderness. Full active and passive range of motion (180 flex Huel Cote /150Abd /90ER /70IR), strength 5/5 throughout. IR, ER, and ER@90  within normal limits.Thumb to L1 with some tenderness (right side was asymptomatic to T10-12). Sensation intact. Peripheral pulses intact.  Assessment and plan:  Neck and shoulder pain Patient is here to follow-up on his cervical MRI due to suspected cervical retinopathy. No worsening in symptoms since our last visit. No red flag symptoms at this time. I discussed my impression of his MRI and the foreseeable options he may have. Patient seemed to appreciate this discussion and was  hopeful to have further treatment with his previous orthopedic office. - Encouraged contacting orthopedic office to set up an appointment. I informed patient that if another referral needed to be made he is to contact our office.  Next: My hope is patient will attempt less invasive means of pain control with spinal nerve steroid injections. However, due to patient's surgical history it is likely he will eventually opt for surgical intervention. That said, if surgeons impression of patient's MRI warrants surgery at this time then I would not disagree.   Elberta Leatherwood, MD,MS,  PGY3 01/18/2017 7:07 PM

## 2017-01-18 NOTE — Patient Instructions (Signed)
It was a pleasure seeing you today in our clinic. Today we discussed your MRI results. Here is the treatment plan we have discussed and agreed upon together:   - I would move forward with contacting your orthopedic surgeon's office. As discussed in clinic today I feel that looking into spinal steroid injections would be most appropriate at this time. If these do not provide any relief moving forward with surgery would be an option they could provide. - Continue taking all of your medications as prescribed. - If you have any acute changes or questions do not hesitate to contact our office.

## 2017-01-18 NOTE — Assessment & Plan Note (Addendum)
Patient is here to follow-up on his cervical MRI due to suspected cervical retinopathy. No worsening in symptoms since our last visit. No red flag symptoms at this time. I discussed my impression of his MRI and the foreseeable options he may have. Patient seemed to appreciate this discussion and was hopeful to have further treatment with his previous orthopedic office. - Encouraged contacting orthopedic office to set up an appointment. I informed patient that if another referral needed to be made he is to contact our office.  Next: My hope is patient will attempt less invasive means of pain control with spinal nerve steroid injections. However, due to patient's surgical history it is likely he will eventually opt for surgical intervention. That said, if surgeons impression of patient's MRI warrants surgery at this time then I would not disagree.

## 2017-02-12 ENCOUNTER — Other Ambulatory Visit: Payer: Self-pay | Admitting: *Deleted

## 2017-02-15 NOTE — Telephone Encounter (Signed)
Pt current Rx will expire 04/2017. Will not fill at this time.

## 2017-02-17 ENCOUNTER — Ambulatory Visit: Payer: BLUE CROSS/BLUE SHIELD | Admitting: Family Medicine

## 2017-02-19 ENCOUNTER — Ambulatory Visit (INDEPENDENT_AMBULATORY_CARE_PROVIDER_SITE_OTHER): Payer: BLUE CROSS/BLUE SHIELD | Admitting: Family Medicine

## 2017-02-19 DIAGNOSIS — G43819 Other migraine, intractable, without status migrainosus: Secondary | ICD-10-CM | POA: Diagnosis not present

## 2017-02-19 DIAGNOSIS — I10 Essential (primary) hypertension: Secondary | ICD-10-CM | POA: Diagnosis not present

## 2017-02-19 DIAGNOSIS — M4802 Spinal stenosis, cervical region: Secondary | ICD-10-CM | POA: Diagnosis not present

## 2017-02-19 DIAGNOSIS — G2581 Restless legs syndrome: Secondary | ICD-10-CM | POA: Insufficient documentation

## 2017-02-19 MED ORDER — DOXEPIN HCL 50 MG PO CAPS: ORAL_CAPSULE | ORAL | 3 refills | Status: DC

## 2017-02-19 NOTE — Patient Instructions (Signed)
Thank you for coming in to see Jose Shea today. Please see below to review our plan for today's visit.  1. Follow up with sports medicine for therapy concerning your neck and left shoulder. 2. Your blood pressure is well controlled. I will not make any adjustments at this time. 3. I have refilled your doxepin. Please go pick up her prescription.  Return to clinic in 1 year or sooner if needed.  Please call the clinic at [phone] if your symptoms worsen or you have any concerns. It was my pleasure to see you. -- Harriet Butte, [place], PGY-2

## 2017-02-19 NOTE — Assessment & Plan Note (Addendum)
Chronic. Controlled. On lisinopril. --Continue lisinopril 20 mg daily

## 2017-02-19 NOTE — Assessment & Plan Note (Signed)
Chronic. Minimal improvement with steroid injection. Currently receiving physical therapy twice daily. Followed by sports medicine. --Patient to follow-up with sports medicine for further management --Continue naproxen 500 mg twice daily with meals as needed for pain

## 2017-02-19 NOTE — Progress Notes (Signed)
   Subjective:   Patient ID: Jose Shea.    DOB: [birth year], 62 y.o. male   MRN: [id number]  CC: "Preventative health visit"  HPI: Jose Shea. is a 61 y.o. male who presents to clinic today for preventative health. Problems discussed today are as follows:  Left shoulder pain: Patient experiencing left shoulder pain with radiculopathy secondary to bulging disc and cervical stenosis identified on MRI. He is currently being seen at the sports med clinic and received an injection with minimal relief. He is getting physical therapy twice weekly. ROS: Denies chest pain, dyspnea, palpitations, diaphoresis.  Hypertension: Patient taking his medications without issues. 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  Migraines: Well controlled with using his doxepin and nadolol. Patient does have naratriptan if he has a breakthrough migraine but does not use medication on the job.  Complete ROS performed, see HPI for pertinent.  [place]: HTN, history of gastric ulcer, HLD, migraines. Surgical nasal sinus surgery, lumbar laminectomy/decompression, hernia repair. Family without medical history. Smoking status reviewed. Medications reviewed.  Objective:   BP 120/78   Pulse (!) 57   Temp 98.6 F (37 C) (Oral)   Ht 5\' 9"  (1.753 m)   Wt 153 lb 9.6 oz (69.7 kg)   SpO2 97%   BMI 22.68 kg/m  Vitals and nursing note reviewed.  General: well nourished, well developed, in no acute distress with non-toxic appearance HEENT: normocephalic, atraumatic, moist mucous membranes Neck: supple, non-tender without lymphadenopathy CV: regular rate and rhythm without murmurs, rubs, or gallops, no lower extremity edema Lungs: clear to auscultation bilaterally with normal work of breathing Abdomen: soft, non-tender, non-distended, no masses or organomegaly palpable, normoactive bowel sounds Skin: warm,  dry, no rashes or lesions, cap refill < 2 seconds Extremities: warm and well perfused, normal tone  Assessment & Plan:   Migraine Chronic. Controlled with doxepin and nadolol. Has naratriptan for breakthrough. --Continue doxepin and nadolol for migraine prophylaxis and naratriptan for breakthrough --Refill for doxepin filled  Primary hypertension Chronic. Controlled. On lisinopril. --Continue lisinopril 20 mg daily  Cervical stenosis of spine with radiculopathy Chronic. Minimal improvement with steroid injection. Currently receiving physical therapy twice daily. Followed by sports medicine. --Patient to follow-up with sports medicine for further management --Continue naproxen 500 mg twice daily with meals as needed for pain  No orders of the defined types were placed in this encounter.  Meds ordered this encounter  Medications  . doxepin (SINEQUAN) 50 MG capsule    Sig: TAKE 1 CAPSULE (50 MG TOTAL) BY MOUTH AT BEDTIME.    Dispense:  90 capsule    Refill:  [place], [place], PGY-2 02/19/2017 5:04 PM

## 2017-02-19 NOTE — Assessment & Plan Note (Addendum)
Chronic. Controlled with doxepin and nadolol. Has naratriptan for breakthrough. --Continue doxepin and nadolol for migraine prophylaxis and naratriptan for breakthrough --Refill for doxepin filled

## 2017-03-31 ENCOUNTER — Telehealth: Payer: Self-pay | Admitting: Family Medicine

## 2017-03-31 NOTE — Telephone Encounter (Signed)
form dropped off for at front desk for completion.  Verified that patient section of form has been completed.  Last DOS/WCC with PCP was 02/19/17.  Placed form in red team folder to be completed by clinical staff.  Jose Shea

## 2017-04-01 NOTE — Telephone Encounter (Signed)
LM for patient that form is ready for pick up at the front desk. Jazmin Hartsell,CMA

## 2017-04-01 NOTE — Telephone Encounter (Signed)
Forms placed in PCP box. 

## 2017-04-01 NOTE — Telephone Encounter (Signed)
Reviewed, completed, and signed form.  Note routed to RN team inbasket and placed completed form in Clinic RN's office (wall pocket above desk).  Wadsworth Bing, DO

## 2017-04-20 ENCOUNTER — Other Ambulatory Visit: Payer: Self-pay | Admitting: *Deleted

## 2017-04-20 MED ORDER — ROPINIROLE HCL 0.25 MG PO TABS: ORAL_TABLET | ORAL | 1 refills | Status: DC

## 2017-07-30 DIAGNOSIS — M4322 Fusion of spine, cervical region: Secondary | ICD-10-CM | POA: Diagnosis not present

## 2017-07-30 DIAGNOSIS — M542 Cervicalgia: Secondary | ICD-10-CM | POA: Diagnosis not present

## 2017-08-02 DIAGNOSIS — M542 Cervicalgia: Secondary | ICD-10-CM | POA: Diagnosis not present

## 2017-08-02 DIAGNOSIS — M4322 Fusion of spine, cervical region: Secondary | ICD-10-CM | POA: Diagnosis not present

## 2017-08-03 DIAGNOSIS — M4322 Fusion of spine, cervical region: Secondary | ICD-10-CM | POA: Diagnosis not present

## 2017-08-04 ENCOUNTER — Other Ambulatory Visit: Payer: Self-pay | Admitting: Family Medicine

## 2017-08-04 MED ORDER — NAPROXEN 500 MG PO TABS: 500.0000 mg | ORAL_TABLET | Freq: Two times a day (BID) | ORAL | 5 refills | Status: DC

## 2017-08-05 DIAGNOSIS — M542 Cervicalgia: Secondary | ICD-10-CM | POA: Diagnosis not present

## 2017-08-05 DIAGNOSIS — M4322 Fusion of spine, cervical region: Secondary | ICD-10-CM | POA: Diagnosis not present

## 2017-08-09 DIAGNOSIS — M542 Cervicalgia: Secondary | ICD-10-CM | POA: Diagnosis not present

## 2017-08-09 DIAGNOSIS — M4322 Fusion of spine, cervical region: Secondary | ICD-10-CM | POA: Diagnosis not present

## 2017-08-11 DIAGNOSIS — M542 Cervicalgia: Secondary | ICD-10-CM | POA: Diagnosis not present

## 2017-08-11 DIAGNOSIS — M4322 Fusion of spine, cervical region: Secondary | ICD-10-CM | POA: Diagnosis not present

## 2017-08-17 DIAGNOSIS — M542 Cervicalgia: Secondary | ICD-10-CM | POA: Diagnosis not present

## 2017-08-17 DIAGNOSIS — M4322 Fusion of spine, cervical region: Secondary | ICD-10-CM | POA: Diagnosis not present

## 2017-08-19 DIAGNOSIS — M542 Cervicalgia: Secondary | ICD-10-CM | POA: Diagnosis not present

## 2017-08-19 DIAGNOSIS — M4322 Fusion of spine, cervical region: Secondary | ICD-10-CM | POA: Diagnosis not present

## 2017-08-24 DIAGNOSIS — M542 Cervicalgia: Secondary | ICD-10-CM | POA: Diagnosis not present

## 2017-08-24 DIAGNOSIS — M4322 Fusion of spine, cervical region: Secondary | ICD-10-CM | POA: Diagnosis not present

## 2017-08-26 DIAGNOSIS — M542 Cervicalgia: Secondary | ICD-10-CM | POA: Diagnosis not present

## 2017-08-26 DIAGNOSIS — M4322 Fusion of spine, cervical region: Secondary | ICD-10-CM | POA: Diagnosis not present

## 2017-08-31 DIAGNOSIS — M542 Cervicalgia: Secondary | ICD-10-CM | POA: Diagnosis not present

## 2017-08-31 DIAGNOSIS — M4322 Fusion of spine, cervical region: Secondary | ICD-10-CM | POA: Diagnosis not present

## 2017-09-02 DIAGNOSIS — M4322 Fusion of spine, cervical region: Secondary | ICD-10-CM | POA: Diagnosis not present

## 2017-09-02 DIAGNOSIS — M542 Cervicalgia: Secondary | ICD-10-CM | POA: Diagnosis not present

## 2017-09-06 ENCOUNTER — Other Ambulatory Visit: Payer: Self-pay | Admitting: *Deleted

## 2017-09-06 DIAGNOSIS — G43819 Other migraine, intractable, without status migrainosus: Secondary | ICD-10-CM

## 2017-09-07 ENCOUNTER — Ambulatory Visit (INDEPENDENT_AMBULATORY_CARE_PROVIDER_SITE_OTHER): Payer: Commercial Managed Care - PPO | Admitting: Family Medicine

## 2017-09-07 ENCOUNTER — Other Ambulatory Visit: Payer: Self-pay

## 2017-09-07 VITALS — BP 108/68 | HR 52 | Temp 98.5°F | Wt 161.0 lb

## 2017-09-07 DIAGNOSIS — I10 Essential (primary) hypertension: Secondary | ICD-10-CM

## 2017-09-07 DIAGNOSIS — M4322 Fusion of spine, cervical region: Secondary | ICD-10-CM | POA: Diagnosis not present

## 2017-09-07 DIAGNOSIS — M542 Cervicalgia: Secondary | ICD-10-CM | POA: Diagnosis not present

## 2017-09-07 DIAGNOSIS — M5386 Other specified dorsopathies, lumbar region: Secondary | ICD-10-CM | POA: Insufficient documentation

## 2017-09-07 DIAGNOSIS — G43819 Other migraine, intractable, without status migrainosus: Secondary | ICD-10-CM

## 2017-09-07 DIAGNOSIS — E782 Mixed hyperlipidemia: Secondary | ICD-10-CM | POA: Diagnosis not present

## 2017-09-07 MED ORDER — DULOXETINE HCL 60 MG PO CPEP: 60.0000 mg | ORAL_CAPSULE | Freq: Every day | ORAL | 3 refills | Status: DC

## 2017-09-07 NOTE — Patient Instructions (Addendum)
Thank you for coming in to see [place] today. Please see below to review our plan for today's visit.  1.  I have started you on any medication called Cymbalta.  Take this daily to help prevent worsening of your sciatica.  He may take a couple weeks for your pain to improve.  Follow-up with your surgeon concerning her back surgeries to be sure there is no complications.  I would await to return to your job until you are evaluated and cleared by the surgeon.  I would like to see you again in about a month. 2.  I will call you if your lab results are abnormal, otherwise expect to receive them in the mail.  Please call the clinic at [phone] if your symptoms worsen or you have any concerns. It was our pleasure to serve you.  Harriet Butte, [place], PGY-2

## 2017-09-07 NOTE — Assessment & Plan Note (Addendum)
Chronic.  Not currently on statin therapy.  No history of CAD or CVA/TIA. - Obtaining LDL

## 2017-09-07 NOTE — Progress Notes (Signed)
Subjective   Patient ID: Jose Shea.    DOB: 03-01-1956, 62 y.o. male   MRN: [id number]  CC: "Cholesterol check"  HPI: Jose Shea. is a 62 y.o. male who presents to clinic today for the following:  Right-sided sciatica: Has a history of similar pain radiating down to his toe related to pinched nerve and lumbar spine which was surgically corrected with lumbar laminectomy.  Patient states pain has returned with aching radiating from buttocks to right calf.  Pain is not worse were improved with flexion or extension of the spine.  Patient takes muscle relaxer without improvement.  He recently underwent injury for cervical spine 6 months ago.  Hypertension: Patient tolerating lisinopril.  He does not check his blood pressure regularly.  Patient is making efforts to stay active and avoid excessive sodium intake.  Hyperlipidemia: Patient making efforts to eat healthy and avoid excessive carbohydrate intake.  He would like his cholesterol checked today.  He is not currently on any statins.  He does not take aspirin given history of gastric ulcer.  Migraines: Currently on multiple medications for migraine prevention.  He states he occasionally has episodes once every 10 days but usually resolves with use of medication.  ROS: see HPI for pertinent.  [place]: HTN, history of gastric ulcer, HLD, migraines.  Surgical nasal sinus surgery, lumbar laminectomy/decompression, hernia repair.  Family history unremarkable.  Smoking status reviewed.  Medications reviewed.  Objective   BP 108/68   Pulse (!) 52   Temp 98.5 F (36.9 C) (Oral)   Wt 161 lb (73 kg)   SpO2 95%   BMI 23.78 kg/m  Vitals and nursing note reviewed.  General: well nourished, well developed, NAD with non-toxic appearance HEENT: normocephalic, atraumatic, moist mucous membranes Neck: supple, non-tender without lymphadenopathy Cardiovascular: regular rate and rhythm without murmurs, rubs, or gallops Lungs: clear to  auscultation bilaterally with normal work of breathing Abdomen: soft, non-tender, non-distended, normoactive bowel sounds Skin: warm, dry, no rashes or lesions, cap refill < 2 seconds Extremities: warm and well perfused, normal tone, no edema, 5/5 motor strength all 4 extremities, positive straight leg raise test on right, stable gait  Assessment & Plan   Sciatica of right side associated with disorder of lumbar spine Acute on chronic.  Has history of cervical and lumbar spine surgery.  Sciatica mild in severity.  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 - Initiating Cymbalta 60 mg daily - Advised patient to contact orthopedic surgeon to discuss follow-up - RTC 1 month  Primary hypertension Chronic.  Well-controlled.  BP 108/68 today.  Currently on ACE inhibitor beta-blocker. - Continue lisinopril 20 mg daily, nadolol 160 mg daily - Avoiding aspirin daily given history of gastric ulcer - Obtaining CBC with differential, CMET, LDL  Mixed hyperlipidemia Chronic.  Not currently on statin therapy.  No history of CAD or CVA/TIA. - Obtaining LDL  Migraine Chronic.  Well-controlled. - Continue doxepin 50 mg daily and nadolol 160 mg daily for prophylaxis and naratriptan 2.5 mg for abortion  Orders Placed This Encounter  Procedures  . CMP14+EGFR  . LDL cholesterol, direct  . CBC with Differential/Platelet   Meds ordered this encounter  Medications  . DULoxetine (CYMBALTA) 60 MG capsule    Sig: Take 1 capsule (60 mg total) by mouth daily.    Dispense:  90 capsule    Refill:  [place], [place], PGY-2 09/07/2017,  5:29 PM

## 2017-09-07 NOTE — Assessment & Plan Note (Addendum)
Chronic.  Well-controlled. - Continue doxepin 50 mg daily and nadolol 160 mg daily for prophylaxis and naratriptan 2.5 mg for abortion

## 2017-09-07 NOTE — Assessment & Plan Note (Addendum)
Acute on chronic.  Has history of cervical and lumbar spine surgery.  Sciatica mild in severity.  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 - Initiating Cymbalta 60 mg daily - Advised patient to contact orthopedic surgeon to discuss follow-up - RTC 1 month

## 2017-09-07 NOTE — Assessment & Plan Note (Addendum)
Chronic.  Well-controlled.  BP 108/68 today.  Currently on ACE inhibitor beta-blocker. - Continue lisinopril 20 mg daily, nadolol 160 mg daily - Avoiding aspirin daily given history of gastric ulcer - Obtaining CBC with differential, CMET, LDL

## 2017-09-08 LAB — CBC WITH DIFFERENTIAL/PLATELET
BASOS ABS: 0 10*3/uL (ref 0.0–0.2)
Basos: 0 %
EOS (ABSOLUTE): 0.3 10*3/uL (ref 0.0–0.4)
Eos: 7 %
HEMOGLOBIN: 16.2 g/dL (ref 13.0–17.7)
Hematocrit: 47.7 % (ref 37.5–51.0)
IMMATURE GRANS (ABS): 0 10*3/uL (ref 0.0–0.1)
Immature Granulocytes: 0 %
LYMPHS: 24 %
Lymphocytes Absolute: 1.2 10*3/uL (ref 0.7–3.1)
MCH: 29.3 pg (ref 26.6–33.0)
MCHC: 34 g/dL (ref 31.5–35.7)
MCV: 86 fL (ref 79–97)
MONOCYTES: 14 %
Monocytes Absolute: 0.7 10*3/uL (ref 0.1–0.9)
NEUTROS PCT: 55 %
Neutrophils Absolute: 2.7 10*3/uL (ref 1.4–7.0)
PLATELETS: 180 10*3/uL (ref 150–379)
RBC: 5.52 x10E6/uL (ref 4.14–5.80)
RDW: 13.6 % (ref 12.3–15.4)
WBC: 4.9 10*3/uL (ref 3.4–10.8)

## 2017-09-08 LAB — CMP14+EGFR
A/G RATIO: 1.7 (ref 1.2–2.2)
ALT: 27 IU/L (ref 0–44)
AST: 26 IU/L (ref 0–40)
Albumin: 4.3 g/dL (ref 3.5–5.5)
Alkaline Phosphatase: 62 IU/L (ref 39–117)
BILIRUBIN TOTAL: 0.3 mg/dL (ref 0.0–1.2)
BUN / CREAT RATIO: 19 (ref 9–20)
BUN: 18 mg/dL (ref 6–24)
CALCIUM: 9.4 mg/dL (ref 8.7–10.2)
CHLORIDE: 103 mmol/L (ref 96–106)
CO2: 25 mmol/L (ref 20–29)
Creatinine, Ser: 0.94 mg/dL (ref 0.76–1.27)
GFR, EST AFRICAN AMERICAN: 102 mL/min/{1.73_m2} (ref >59)
GFR, EST NON AFRICAN AMERICAN: 88 mL/min/{1.73_m2} (ref >59)
Globulin, Total: 2.5 g/dL (ref 1.5–4.5)
Glucose: 89 mg/dL (ref 65–99)
POTASSIUM: 4.9 mmol/L (ref 3.5–5.2)
SODIUM: 143 mmol/L (ref 134–144)
TOTAL PROTEIN: 6.8 g/dL (ref 6.0–8.5)

## 2017-09-08 LAB — LDL CHOLESTEROL, DIRECT: LDL Direct: 133 mg/dL — ABNORMAL HIGH (ref 0–99)

## 2017-09-09 DIAGNOSIS — M4322 Fusion of spine, cervical region: Secondary | ICD-10-CM | POA: Diagnosis not present

## 2017-09-09 DIAGNOSIS — M542 Cervicalgia: Secondary | ICD-10-CM | POA: Diagnosis not present

## 2017-09-10 ENCOUNTER — Other Ambulatory Visit: Payer: Self-pay | Admitting: Family Medicine

## 2017-09-10 ENCOUNTER — Other Ambulatory Visit: Payer: Self-pay | Admitting: *Deleted

## 2017-09-10 ENCOUNTER — Telehealth: Payer: Self-pay | Admitting: Family Medicine

## 2017-09-10 DIAGNOSIS — E782 Mixed hyperlipidemia: Secondary | ICD-10-CM

## 2017-09-10 MED ORDER — FLUTICASONE PROPIONATE 50 MCG/ACT NA SUSP: NASAL | 6 refills | Status: AC

## 2017-09-10 MED ORDER — ATORVASTATIN CALCIUM 20 MG PO TABS: 20.0000 mg | ORAL_TABLET | Freq: Every day | ORAL | 3 refills | Status: DC

## 2017-09-10 NOTE — Telephone Encounter (Signed)
Contacted patient regarding persistently elevated LDL.  ASCVD score recommending moderate intensity statin.  Left message informing patient of new prescription for Lipitor 20 mg daily.  Reviewed return precautions and red flags.  Also placing recheck of LDL in 8 weeks.  Instructed patient to contact clinic if he has any further questions.  Harriet Butte, [place], PGY-2

## 2017-09-13 DIAGNOSIS — M542 Cervicalgia: Secondary | ICD-10-CM | POA: Diagnosis not present

## 2017-09-13 DIAGNOSIS — M5416 Radiculopathy, lumbar region: Secondary | ICD-10-CM | POA: Diagnosis not present

## 2017-09-19 DIAGNOSIS — M545 Low back pain: Secondary | ICD-10-CM | POA: Diagnosis not present

## 2017-09-21 DIAGNOSIS — M5416 Radiculopathy, lumbar region: Secondary | ICD-10-CM | POA: Diagnosis not present

## 2017-09-21 DIAGNOSIS — M545 Low back pain: Secondary | ICD-10-CM | POA: Diagnosis not present

## 2017-09-22 NOTE — Progress Notes (Signed)
   Subjective   Patient ID: Jose Shea.    DOB: Nov 24, 1955, 62 y.o. male   MRN: [id number]  CC: "Follow-up"  HPI: Jose Shea. is a 62 y.o. male who presents to clinic today for the following:  Sciatica: The patient continues to have radiating pain from the right buttocks down to his right calf with a 5/10 severity.  Since his last appointment, he followed up with his orthopedic surgeon mark Dumonski, MD at [place] who performed his prior cervical and lumbar procedures.  Patient underwent repeat MRI showing bulging disc at L5-S1 per patient report given no results in chart review.  Patient was given 6-day thyroid burst and diclofenac 75 mg twice daily.  He was instructed to discontinue the Cymbalta.  He denies fevers or chills, headache, neck stiffness or pain, unstable gait, loss of motor function, sensory loss, bowel or bladder incontinence.  ROS: see HPI for pertinent.  [place]: HTN, history of gastric ulcer, HLD, migraines. Surgical nasal sinus surgery, lumbar laminectomy/decompression, hernia repair.  Family history unremarkable.  Smoking status reviewed.  Medications reviewed.  Objective   BP 106/68   Pulse (!) 58   Temp 98.4 F (36.9 C) (Oral)   Ht 5' 8.5" (1.74 m)   Wt 159 lb (72.1 kg)   SpO2 99%   BMI 23.82 kg/m  Vitals and nursing note reviewed.  General: well nourished, well developed, NAD with non-toxic appearance HEENT: normocephalic, atraumatic, moist mucous membranes Neck: supple, non-tender without lymphadenopathy, no nuchal rigidity Cardiovascular: regular rate and rhythm without murmurs, rubs, or gallops Lungs: clear to auscultation bilaterally with normal work of breathing Skin: warm, dry, no rashes or lesions, cap refill < 2 seconds Extremities: warm and well perfused, normal tone, no edema MSK: 5/5 motor strength in all 4 extremities, 2+ DTR bilaterally  Assessment & Plan   Sciatica of right side associated with disorder of lumbar  spine Chronic.  Likely due to bulging disc of L5-S1 by recent MRI of lumbar spine per patient report.  Currently followed by orthopedic surgery who is recommending nerve block.  Pain seems to be well controlled following steroid burst and diclofenac.  No red flags. - Advised patient to follow-up with scheduled orthopedic follow-up - Discontinue Cymbalta 20 mg daily - Continue diclofenac 75 mg twice daily with instructions to avoid additional NSAIDs - Reviewed return precautions - RTC 2 months or sooner if needed  No orders of the defined types were placed in this encounter.  No orders of the defined types were placed in this encounter.   Harriet Butte, [place], PGY-2 09/23/2017, 10:05 AM

## 2017-09-23 ENCOUNTER — Encounter: Payer: Self-pay | Admitting: Family Medicine

## 2017-09-23 ENCOUNTER — Ambulatory Visit (INDEPENDENT_AMBULATORY_CARE_PROVIDER_SITE_OTHER): Payer: Commercial Managed Care - PPO | Admitting: Family Medicine

## 2017-09-23 ENCOUNTER — Other Ambulatory Visit: Payer: Self-pay

## 2017-09-23 DIAGNOSIS — M5386 Other specified dorsopathies, lumbar region: Secondary | ICD-10-CM | POA: Diagnosis not present

## 2017-09-23 NOTE — Patient Instructions (Addendum)
Thank you for coming in to see [place] today. Please see below to review our plan for today's visit.  Discontinue the Cymbalta.  Do not take additional NSAIDs with the diclofenac including medications such as ibuprofen, Motrin, Advil, Aleve, BC powder.  Follow-up with your orthopedic appointment.  I would like to see you again in 2 months to recheck your cholesterol.  Please call the clinic at [phone] if your symptoms worsen or you have any concerns. It was our pleasure to serve you.  Harriet Butte, [place], PGY-2

## 2017-09-23 NOTE — Assessment & Plan Note (Signed)
Chronic.  Likely due to bulging disc of L5-S1 by recent MRI of lumbar spine per patient report.  Currently followed by orthopedic surgery who is recommending nerve block.  Pain seems to be well controlled following steroid burst and diclofenac.  No red flags. - Advised patient to follow-up with scheduled orthopedic follow-up - Discontinue Cymbalta 20 mg daily - Continue diclofenac 75 mg twice daily with instructions to avoid additional NSAIDs - Reviewed return precautions - RTC 2 months or sooner if needed

## 2017-10-01 ENCOUNTER — Other Ambulatory Visit: Payer: Self-pay

## 2017-10-01 DIAGNOSIS — G43819 Other migraine, intractable, without status migrainosus: Secondary | ICD-10-CM

## 2017-10-03 MED ORDER — DOXEPIN HCL 50 MG PO CAPS: ORAL_CAPSULE | ORAL | 3 refills | Status: DC

## 2017-10-12 DIAGNOSIS — M5416 Radiculopathy, lumbar region: Secondary | ICD-10-CM | POA: Diagnosis not present

## 2017-10-28 ENCOUNTER — Other Ambulatory Visit: Payer: Self-pay

## 2017-10-28 MED ORDER — NADOLOL 80 MG PO TABS: 160.0000 mg | ORAL_TABLET | Freq: Every day | ORAL | 3 refills | Status: DC

## 2017-11-01 ENCOUNTER — Other Ambulatory Visit: Payer: Self-pay | Admitting: Family Medicine

## 2017-11-02 DIAGNOSIS — M47816 Spondylosis without myelopathy or radiculopathy, lumbar region: Secondary | ICD-10-CM | POA: Diagnosis not present

## 2017-11-17 DIAGNOSIS — M47816 Spondylosis without myelopathy or radiculopathy, lumbar region: Secondary | ICD-10-CM | POA: Diagnosis not present

## 2017-11-19 ENCOUNTER — Encounter (HOSPITAL_COMMUNITY): Payer: Self-pay | Admitting: *Deleted

## 2017-11-19 ENCOUNTER — Emergency Department (HOSPITAL_COMMUNITY)
Admission: EM | Admit: 2017-11-19 | Discharge: 2017-11-19 | Disposition: A | Discharge status: Home / Self Care | Payer: Commercial Managed Care - PPO | Attending: Emergency Medicine | Admitting: Emergency Medicine

## 2017-11-19 ENCOUNTER — Emergency Department (HOSPITAL_COMMUNITY): Payer: Commercial Managed Care - PPO

## 2017-11-19 DIAGNOSIS — M5416 Radiculopathy, lumbar region: Secondary | ICD-10-CM

## 2017-11-19 DIAGNOSIS — Z79899 Other long term (current) drug therapy: Secondary | ICD-10-CM | POA: Insufficient documentation

## 2017-11-19 DIAGNOSIS — I10 Essential (primary) hypertension: Secondary | ICD-10-CM | POA: Diagnosis not present

## 2017-11-19 DIAGNOSIS — M79652 Pain in left thigh: Secondary | ICD-10-CM | POA: Diagnosis present

## 2017-11-19 LAB — BASIC METABOLIC PANEL
ANION GAP: 11 (ref 5–15)
BUN: 24 mg/dL — ABNORMAL HIGH (ref 6–20)
CALCIUM: 9.2 mg/dL (ref 8.9–10.3)
CO2: 26 mmol/L (ref 22–32)
CREATININE: 1.08 mg/dL (ref 0.61–1.24)
Chloride: 103 mmol/L (ref 101–111)
Glucose, Bld: 98 mg/dL (ref 65–99)
Potassium: 4.2 mmol/L (ref 3.5–5.1)
SODIUM: 140 mmol/L (ref 135–145)

## 2017-11-19 MED ORDER — HYDROCODONE-ACETAMINOPHEN 5-325 MG PO TABS: 1.0000 | ORAL_TABLET | Freq: Four times a day (QID) | ORAL | 0 refills | Status: DC | PRN

## 2017-11-19 MED ORDER — GADOBENATE DIMEGLUMINE 529 MG/ML IV SOLN
15.0000 mL | Freq: Once | INTRAVENOUS | Status: AC | PRN
  Administered 2017-11-19: 20 mL via INTRAVENOUS

## 2017-11-19 NOTE — ED Provider Notes (Signed)
[facility] DEPT Provider Note   CSN: [id number] Arrival date & time: 11/19/17  1504     History   Chief Complaint Chief Complaint  Patient presents with  . Leg Pain    HPI Jose Shea. is a 62 y.o. male.  [place]anes of low back pain radiating to left anterior thigh and left shin onset yesterday worse with moving and improved with remaining still.  He had an epidural injection of his lumbosacral spine performed 15 days ago and again 2 days ago by Dr.Dumonski and he is treated himself with a muscle relaxer, part of an oxycodone tablet and Valium, without adequate pain relief.  He denies loss of bladder or bowel control denies fever.  He is been walking with a cane for the past 2 days.  No other associated symptoms.  HPI  Past Medical History:  Diagnosis Date  . Blood dyscrasia    Thrombocytopenia from taking  a medication  . Chronic headaches   . GERD (gastroesophageal reflux disease)   . Hypertension   . Pneumonia   . PONV (postoperative nausea and vomiting)   . Restless leg     Patient Active Problem List   Diagnosis Date Noted  . Sciatica of right side associated with disorder of lumbar spine 09/07/2017  . Restless leg syndrome 02/19/2017  . Cervical stenosis of spine with radiculopathy 02/19/2017  . Mixed hyperlipidemia 05/20/2013  . Migraine 09/23/2006  . Primary hypertension 09/23/2006  . History of gastric ulcer 09/23/2006    Past Surgical History:  Procedure Laterality Date  . COLONOSCOPY    . HERNIA REPAIR    . LUMBAR LAMINECTOMY/DECOMPRESSION MICRODISCECTOMY N/A 12/18/2015   Procedure: LUMBAR 4-5 DECOMPRESSION;  Surgeon: Phylliss Bob, MD;  Location: [facility];  Service: Orthopedics;  Laterality: N/A;  LUMBAR 4-5 DECOMPRESSION  . NASAL SINUS SURGERY  2005        Home Medications    Prior to Admission medications   Medication Sig Start Date End Date Taking? Authorizing Provider  atorvastatin (LIPITOR) 20 MG tablet Take 1  tablet (20 mg total) by mouth daily. 09/10/17  Yes [place] Bing, DO  cetirizine (ZYRTEC) 10 MG tablet Take 10 mg by mouth daily.   Yes [provider]  doxepin (SINEQUAN) 50 MG capsule TAKE 1 CAPSULE (50 MG TOTAL) BY MOUTH AT BEDTIME. 10/03/17  Yes [place] Bing, DO  esomeprazole (NEXIUM) 20 MG capsule Take 1 capsule (20 mg total) by mouth daily at 12 noon. 12/04/16  Yes McKeag, Marylynn Pearson, MD  fluticasone [facility]) 50 MCG/ACT nasal spray USE 2 SPRAYS INTO EACH NOSTRIL DAILY 09/10/17  Yes [place] Bing, DO  lisinopril (PRINIVIL,ZESTRIL) 20 MG tablet TAKE 1 TABLET (20 MG TOTAL) BY MOUTH DAILY. 12/18/16  Yes McKeag, Marylynn Pearson, MD  Multiple Vitamin (MULTIVITAMIN WITH MINERALS) TABS tablet Take 1 tablet by mouth daily.   Yes [provider]  nadolol (CORGARD) 80 MG tablet Take 2 tablets (160 mg total) by mouth daily. 10/28/17  Yes [place] Bing, DO  naratriptan (AMERGE) 2.5 MG tablet TAKE ONE TABLET AT ONSET OF HEADACHE IF RETURNS OR DOES NOT RESOLVE, MAY REPEAT AFTER 4 HOURS 01/01/17  Yes McKeag, Marylynn Pearson, MD  rOPINIRole (REQUIP) 0.25 MG tablet TAKE 1 TABLET BY MOUTH 1-2 HOURS BEFORE BEDTIME FOR RESTLESS LEG SYNDROME AS NEEDED 11/01/17  Yes [place] Bing, DO  tetrahydrozoline 0.05 % ophthalmic solution Place 1 drop into both eyes daily as needed (dry eye).   Yes  [provider]  diclofenac (VOLTAREN) 75 MG EC tablet Take 75 mg by mouth 2 (two) times daily with a meal. 10/25/17   [provider]  DULoxetine (CYMBALTA) 60 MG capsule Take 1 capsule (60 mg total) by mouth daily. Patient not taking: Reported on 11/19/2017 09/07/17   [place] Bing, DO  methocarbamol (ROBAXIN) 750 MG tablet Take 1 tablet by mouth 2 (two) times daily as needed for pain. 10/25/17   [provider]  naproxen (NAPROSYN) 500 MG tablet Take 1 tablet (500 mg total) by mouth 2 (two) times daily with a meal. For 5 days. Then, twice a day AS NEEDED. Patient not taking: Reported on 11/19/2017  08/04/17   [place] Bing, DO    Family History No family history on file.  Social History Social History   Tobacco Use  . Smoking status: Never Smoker  . Smokeless tobacco: Never Used  Substance Use Topics  . Alcohol use: Yes    Alcohol/week: 8.4 oz    Types: 7 Cans of beer, 7 Shots of liquor per week    Comment: one drink a day  . Drug use: No     Allergies   Fluoxetine hcl and Quinine   Review of Systems Review of Systems  Constitutional: Negative.   HENT: Negative.   Respiratory: Negative.   Cardiovascular: Negative.   Gastrointestinal: Negative.   Musculoskeletal: Positive for back pain, gait problem and myalgias.       Walks with cane left thigh pain and left shin pain  Skin: Negative.   Psychiatric/Behavioral: Negative.   All other systems reviewed and are negative.    Physical Exam Updated Vital Signs BP (!) 154/103 (BP Location: Left Arm)   Pulse (!) 54   Temp 99.1 F (37.3 C) (Oral)   Resp 16   Ht 5\' 8"  (1.727 m)   Wt 72.6 kg (160 lb)   SpO2 96%   BMI 24.33 kg/m   Physical Exam  Constitutional: He is oriented to person, place, and time. He appears well-developed and well-nourished.  HENT:  Head: Normocephalic and atraumatic.  Eyes: Pupils are equal, round, and reactive to light. Conjunctivae are normal.  Neck: Neck supple. No tracheal deviation present. No thyromegaly present.  Cardiovascular: Regular rhythm.  No murmur heard. Mildly bradycardic  Pulmonary/Chest: Effort normal and breath sounds normal.  Abdominal: Soft. Bowel sounds are normal. He exhibits no distension. There is no tenderness.  Musculoskeletal: Normal range of motion. He exhibits no edema or tenderness.  Entire spine is nontender.  All 4 extremities without redness or tenderness neurovascularly intact.  No swelling.  Neurological: He is alert and oriented to person, place, and time. Coordination normal.  Walks with cane without difficulty  Skin: Skin is warm and dry.  Capillary refill takes less than 2 seconds. No rash noted.  Psychiatric: He has a normal mood and affect.  Nursing note and vitals reviewed.    ED Treatments / Results  Labs (all labs ordered are listed, but only abnormal results are displayed) Labs Reviewed  BASIC METABOLIC PANEL    EKG None  Radiology No results found.  Procedures Procedures (including critical care time)  Medications Ordered in ED Medications - No data to display  Results for orders placed or performed during the hospital encounter of 39/03/00  Basic metabolic panel  Result Value Ref Range   Sodium 140 135 - 145 mmol/L   Potassium 4.2 3.5 - 5.1 mmol/L   Chloride 103 101 - 111 mmol/L  CO2 26 22 - 32 mmol/L   Glucose, Bld 98 65 - 99 mg/dL   BUN 24 (H) 6 - 20 mg/dL   Creatinine, Ser 1.08 0.61 - 1.24 mg/dL   Calcium 9.2 8.9 - 10.3 mg/dL   GFR calc non Af Amer >60 >60 mL/min   GFR calc Af Amer >60 >60 mL/min   Anion gap 11 5 - 15  Lab work essentially normal. Mr Lumbar Spine W Wo Contrast  Result Date: 11/19/2017 CLINICAL DATA:  Radiculopathy EXAM: MRI LUMBAR SPINE WITHOUT AND WITH CONTRAST TECHNIQUE: Multiplanar and multiecho pulse sequences of the lumbar spine were obtained without and with intravenous contrast. CONTRAST:  5mL MULTIHANCE GADOBENATE DIMEGLUMINE 529 MG/ML IV SOLN COMPARISON:  L-spine MRI 08/10/2015 FINDINGS: Segmentation: Normal. The lowest disc space is considered to be L5-S1. Alignment:  Normal Vertebrae:  Postsurgical changes of prior laminectomy at L4-L5. Conus medullaris and cauda equina: The conus medullaris terminates at the L1 level. The cauda equina and conus medullaris are both normal. Paraspinal and other soft tissues: The visualized aorta, IVC and iliac vessels are normal. The visualized retroperitoneal organs and paraspinal soft tissues are normal. Disc levels: The T12-L2 disc levels are normal. L2-L3: Small disc bulge, unchanged. Narrowing of both lateral recesses without  central spinal canal stenosis. No neural foraminal stenosis. L3-L4: Diffuse disc bulge, unchanged. Bilateral lateral recess narrowing without central spinal canal stenosis. Mild right neural foraminal stenosis, slightly worsened. L4-L5: Postsurgical changes of decompressive laminectomy and microdiscectomy. Moderate right and mild left facet hypertrophy. No spinal canal stenosis. Mild bilateral neural foraminal stenosis, unchanged. L5-S1: Small central disc protrusion with narrowing of both lateral recesses. No central spinal canal stenosis. Mild bilateral foraminal narrowing. IMPRESSION: 1. Postsurgical changes at L4-L5 without residual spinal canal stenosis. The patency of the thecal sac is markedly improved. There is persistent mild bilateral narrowing of the neural foramina. 2. L5-S1 bilateral lateral recess stenosis and mild bilateral foraminal stenosis, unchanged. This could contribute to bilateral L5 or S1 radiculopathy. 3. Slight worsening of mild right neural foraminal stenosis at L3-L4. Bilateral lateral recess narrowing at L2-3 and L3-4, unchanged Electronically Signed   By: Ulyses Jarred M.D.   On: 11/19/2017 22:49  Indicates that no acute intervention is needed. Initial Impression / Assessment and Plan / ED Course  I have reviewed the triage vital signs and the nursing notes.  Pertinent labs & imaging results that were available during my care of the patient were reviewed by me and considered in my medical decision making (see chart for details).     Patient not given opioid pain medicine while here.  He was okay with not getting pain medicine while here.  He is driving home.  11:05 PM patient is alert and ambulatory  Plan prescription [place] Controlled Substance reporting System queried Keep scheduled appointment with Dr.Dumonski Nov 27, 2017 Final Clinical Impressions(s) / ED Diagnoses  Diagnosis lumbar radiculopathy Final diagnoses:  None    ED Discharge Orders     None       Orlie Dakin, MD 11/19/17 2313

## 2017-11-19 NOTE — ED Notes (Signed)
In MRI now °

## 2017-11-19 NOTE — ED Triage Notes (Signed)
Pt complains of left leg pain since an epidural 2 days ago. Pt was told to come to ED for an MRI.

## 2017-11-19 NOTE — ED Notes (Addendum)
ED Provider at bedside. EDP J 

## 2017-11-19 NOTE — ED Notes (Signed)
Meal given to guest family  [place] sand witch and sprite

## 2017-11-19 NOTE — Discharge Instructions (Addendum)
Take your diclofenac for mild pain or the pain medicine prescribed for bad pain.  Do not take the pain medicine prescribed tonight together with Valium as the combination can be dangerous cause you to stop breathing.  Keep your scheduled appoint with Dr. Lynann Bologna in 8 days

## 2017-11-22 ENCOUNTER — Encounter: Payer: Self-pay | Admitting: Family Medicine

## 2017-11-22 ENCOUNTER — Ambulatory Visit (INDEPENDENT_AMBULATORY_CARE_PROVIDER_SITE_OTHER): Payer: Commercial Managed Care - PPO | Admitting: Family Medicine

## 2017-11-22 ENCOUNTER — Other Ambulatory Visit: Payer: Self-pay

## 2017-11-22 DIAGNOSIS — M5386 Other specified dorsopathies, lumbar region: Secondary | ICD-10-CM

## 2017-11-22 NOTE — Progress Notes (Signed)
   Subjective   Patient ID: Jose Shea.    DOB: 01/07/1956, 62 y.o. male   MRN: [id number]  CC: " Back pain"  HPI: Jose Shea. is a 62 y.o. male who presents to clinic today for the following:  Back pain: Patient has long history of lumbar back pain with sciatica notably on the right side, however more recently on the left side.  He was seen by Dr. Lynann Bologna and received epidural injections on 3/19 followed by 4/24.  Patient endorses some improvement of pain a few days following the injection but began having severe pain in the left lower extremity.  He was advised to go to the ED by Dr. Lynann Bologna.  MRI was performed showing diffuse mild stenosis, however nothing that would appear to support reason for pain.  Patient states he has been taking diclofenac and hydrocodone for pain control.  He has a scheduled appointment with Dr. Tessa Lerner on May 4.  He has been using a cane to ambulate.  Denies loss of bowel or bladder control, saddle anesthesia, fevers or chills, loss of motor function or sensory loss.  ROS: see HPI for pertinent.  [place]: HTN, history of gastric ulcer, HLD, migraines.Surgical nasal sinus surgery, lumbar laminectomy/decompression, hernia repair. Family history unremarkable. Smoking status reviewed. Medications reviewed.  Objective   BP 134/74   Pulse (!) 45   Temp 98.5 F (36.9 C) (Oral)   Ht 5\' 9"  (1.753 m)   Wt 160 lb (72.6 kg)   SpO2 99%   BMI 23.63 kg/m  Vitals and nursing note reviewed.  General: male, well nourished, well developed, NAD with non-toxic appearance HEENT: normocephalic, atraumatic, moist mucous membranes Neck: supple, non-tender without lymphadenopathy, no nuchal rigidity Cardiovascular: regular rate and rhythm without murmurs, rubs, or gallops Lungs: clear to auscultation bilaterally with normal work of breathing Skin: warm, dry, no rashes or lesions, cap refill < 2 seconds Extremities: warm and well perfused, normal tone, no  edema MSK: slow but steady gait, limited hip flexion due to tenderness at approximately 120 degrees, tenderness exacerbated with extension lumbar spine, DTRs equal bilaterally at 2+  Assessment & Plan   Sciatica of left side associated with disorder of lumbar spine Acute on chronic.  Suspect pain is secondary to nerve irritation.  Uncertain if this is related to recent epidural injection.  MRI reassuring.  Patient without red flags.  Low concern for cauda equina, epidural abscess, or meningitis. - Reviewed return precautions - Advised patient to follow-up with schedule appointment with Dr. Lynann Bologna and continue diclofenac for pain control and reserve hydrocodone only as needed - Encourage patient to remain ambulatory and active as tolerated  No orders of the defined types were placed in this encounter.  No orders of the defined types were placed in this encounter.   Harriet Butte, [place], PGY-2 11/22/2017, 5:00 PM

## 2017-11-22 NOTE — Patient Instructions (Signed)
Thank you for coming in to see [place] today. Please see below to review our plan for today's visit.  Follow up with Dr. Leda Gauze.  Continue taking the diclofenac twice daily as needed.  I would reserve the hydrocodone only for severe pain.  I would encourage you to remain active despite having low back pain.  This will help prevent your pain from worsening.  Please call the clinic at [phone] if your symptoms worsen or you have any concerns. It was our pleasure to serve you.  Harriet Butte, [place], PGY-2

## 2017-11-22 NOTE — Assessment & Plan Note (Addendum)
Acute on chronic.  Suspect pain is secondary to nerve irritation.  Uncertain if this is related to recent epidural injection.  MRI reassuring.  Patient without red flags.  Low concern for cauda equina, epidural abscess, or meningitis. - Reviewed return precautions - Advised patient to follow-up with schedule appointment with Dr. Lynann Bologna and continue diclofenac for pain control and reserve hydrocodone only as needed - Encourage patient to remain ambulatory and active as tolerated

## 2017-11-27 DIAGNOSIS — M545 Low back pain: Secondary | ICD-10-CM | POA: Diagnosis not present

## 2017-11-27 DIAGNOSIS — M5416 Radiculopathy, lumbar region: Secondary | ICD-10-CM | POA: Diagnosis not present

## 2017-12-13 DIAGNOSIS — M545 Low back pain: Secondary | ICD-10-CM | POA: Diagnosis not present

## 2017-12-13 DIAGNOSIS — M5416 Radiculopathy, lumbar region: Secondary | ICD-10-CM | POA: Diagnosis not present

## 2017-12-21 ENCOUNTER — Other Ambulatory Visit: Payer: Self-pay | Admitting: Family Medicine

## 2017-12-22 ENCOUNTER — Telehealth: Payer: Self-pay

## 2017-12-22 NOTE — Telephone Encounter (Signed)
Completed PA info in CoverMyMeds for naratriptan.  Status pending.  Will recheck status in 24 hours. Wallace Cullens, RN

## 2017-12-22 NOTE — Telephone Encounter (Signed)
Per [facility] this medication does not require PA, [facility] pharmacy aware.  Wallace Cullens, RN

## 2018-01-04 ENCOUNTER — Other Ambulatory Visit: Payer: Self-pay

## 2018-01-05 MED ORDER — LISINOPRIL 20 MG PO TABS: 20.0000 mg | ORAL_TABLET | Freq: Every day | ORAL | 3 refills | Status: DC

## 2018-01-19 DIAGNOSIS — G541 Lumbosacral plexus disorders: Secondary | ICD-10-CM | POA: Diagnosis not present

## 2018-01-19 DIAGNOSIS — I10 Essential (primary) hypertension: Secondary | ICD-10-CM | POA: Diagnosis not present

## 2018-01-19 DIAGNOSIS — M5416 Radiculopathy, lumbar region: Secondary | ICD-10-CM | POA: Diagnosis not present

## 2018-01-25 DIAGNOSIS — M5416 Radiculopathy, lumbar region: Secondary | ICD-10-CM | POA: Diagnosis not present

## 2018-02-03 ENCOUNTER — Other Ambulatory Visit: Payer: Self-pay | Admitting: Orthopedic Surgery

## 2018-02-03 DIAGNOSIS — M5416 Radiculopathy, lumbar region: Secondary | ICD-10-CM

## 2018-02-07 ENCOUNTER — Ambulatory Visit: Payer: Commercial Managed Care - PPO | Admitting: Family Medicine

## 2018-02-14 ENCOUNTER — Ambulatory Visit
Admission: RE | Admit: 2018-02-14 | Discharge: 2018-02-14 | Disposition: A | Discharge status: Home / Self Care | Payer: Commercial Managed Care - PPO | Source: Ambulatory Visit | Attending: Orthopedic Surgery | Admitting: Orthopedic Surgery

## 2018-02-14 DIAGNOSIS — M5416 Radiculopathy, lumbar region: Secondary | ICD-10-CM

## 2018-02-14 DIAGNOSIS — M47817 Spondylosis without myelopathy or radiculopathy, lumbosacral region: Secondary | ICD-10-CM | POA: Diagnosis not present

## 2018-02-14 MED ORDER — METHYLPREDNISOLONE ACETATE 40 MG/ML INJ SUSP (RADIOLOG
120.0000 mg | Freq: Once | INTRAMUSCULAR | Status: AC
  Administered 2018-02-14: 120 mg via EPIDURAL

## 2018-02-14 MED ORDER — IOPAMIDOL (ISOVUE-M 200) INJECTION 41%
1.0000 mL | Freq: Once | INTRAMUSCULAR | Status: AC
  Administered 2018-02-14: 1 mL via EPIDURAL

## 2018-02-14 NOTE — Discharge Instructions (Signed)

## 2018-02-22 NOTE — Progress Notes (Signed)
    Subjective   Patient ID: Jose Shea.    DOB: 05/31/1956, 62 y.o. male   MRN: [id number]  CC: "Back pain follow-up"  HPI: Jose Shea. is a 62 y.o. male who presents to clinic today for the following:  Low back pain: Patient was evaluated by [facility] for an ongoing intermittent left anterior thigh pain.  EMG/NCSP revealed upper lumbar radiculopathy and MRI was largely unremarkable with small disc protrusion at L2-L3 level migrating behind the L3 vertebral body thought to be related to his clinical symptoms.  Patient received an L3 nerve block approximately 1 week ago with instructions to follow-up in 1 week.  Patient states he has not had any pain since the injection but does report improvement of pain with use of Lyrica since April along with recent prescription for Norco and occasional Flexeril at night.  He continues to have pins-and-needles sensation on the posterior aspect of his left leg along with some pain on his anterior thigh but otherwise states this is unchanged since his last visit.  He denies any fevers or chills, loss of bowel or bladder function, saddle anesthesia.  ROS: see HPI for pertinent.  [place]: HTN, history of gastric ulcer, HLD, migraines.Surgical nasal sinus surgery, lumbar laminectomy/decompression, hernia repair. Family history unremarkable. Smoking status reviewed. Medications reviewed.  Objective   BP 123/70 (BP Location: Right Arm, Patient Position: Sitting, Cuff Size: Normal)   Pulse (!) 57   Temp 98.5 F (36.9 C) (Oral)   Ht 5\' 9"  (1.753 m)   Wt 157 lb 6.4 oz (71.4 kg)   SpO2 97%   BMI 23.24 kg/m  Vitals and nursing note reviewed.  General: well nourished, well developed, NAD with non-toxic appearance HEENT: normocephalic, atraumatic, moist mucous membranes Cardiovascular: regular rate and rhythm without murmurs, rubs, or gallops Lungs: clear to auscultation bilaterally with normal work of breathing Skin: warm, dry, no rashes  or lesions, cap refill < 2 seconds Extremities: warm and well perfused, normal tone, no edema Neuro: 5/5 motor strength in all 4 extremities, no patellar DTR bilaterally, 2+ achilles DTR bilaterally, patient able to flex at spine 80% but limited due to low back tenderness particularly at left sacral region, stable gait  Assessment & Plan   Lumbar back pain with radiculopathy affecting left lower extremity Chronic.  History of L4-L5 laminectomy/discectomy.  Currently being followed by [facility] due to persistent radicular symptoms affecting left leg.  No signs of cauda equina.  Mild disc protrusion at L3 region based on MRI which may correlate with symptoms.  Tolerated first injection 1 week ago without improvement.  - Continue following up with orthopedics and consider additional injections and possible microdiscectomy - Advised patient to continue current medications including Lyrica, Norco, and Flexeril nightly - Reviewed return precautions - RTC 3 months  No orders of the defined types were placed in this encounter.  No orders of the defined types were placed in this encounter.   Harriet Butte, [place], PGY-3 02/23/2018, 10:40 AM

## 2018-02-23 ENCOUNTER — Ambulatory Visit (INDEPENDENT_AMBULATORY_CARE_PROVIDER_SITE_OTHER): Payer: Commercial Managed Care - PPO | Admitting: Family Medicine

## 2018-02-23 ENCOUNTER — Encounter: Payer: Self-pay | Admitting: Family Medicine

## 2018-02-23 DIAGNOSIS — M5416 Radiculopathy, lumbar region: Secondary | ICD-10-CM | POA: Diagnosis not present

## 2018-02-23 NOTE — Patient Instructions (Signed)
Thank you for coming in to see [place] today. Please see below to review our plan for today's visit.  Continue taking medications as prescribed for your back pain and follow-up with the scheduled appointment in 1 week.  Please give the injection time to work and consider getting repeat injections based on the orthopedic recommendation.  Make sure they are aware that you would like to avoid surgery and would like to discuss the microdisectomy.  I will see you again in 3 months.  Please call the clinic at [phone] if your symptoms worsen or you have any concerns. It was our pleasure to serve you.  Harriet Butte, [place], PGY-3

## 2018-02-23 NOTE — Assessment & Plan Note (Addendum)
Chronic.  History of L4-L5 laminectomy/discectomy.  Currently being followed by [facility] due to persistent radicular symptoms affecting left leg.  No signs of cauda equina.  Mild disc protrusion at L3 region based on MRI which may correlate with symptoms.  Tolerated first injection 1 week ago without improvement.  - Continue following up with orthopedics and consider additional injections and possible microdiscectomy - Advised patient to continue current medications including Lyrica, Norco, and Flexeril nightly - Reviewed return precautions - RTC 3 months

## 2018-02-28 DIAGNOSIS — M545 Low back pain: Secondary | ICD-10-CM | POA: Diagnosis not present

## 2018-03-16 DIAGNOSIS — M5417 Radiculopathy, lumbosacral region: Secondary | ICD-10-CM | POA: Diagnosis not present

## 2018-03-16 DIAGNOSIS — M545 Low back pain: Secondary | ICD-10-CM | POA: Diagnosis not present

## 2018-03-16 DIAGNOSIS — G894 Chronic pain syndrome: Secondary | ICD-10-CM | POA: Diagnosis not present

## 2018-03-21 DIAGNOSIS — M5137 Other intervertebral disc degeneration, lumbosacral region: Secondary | ICD-10-CM | POA: Diagnosis not present

## 2018-03-21 DIAGNOSIS — M5417 Radiculopathy, lumbosacral region: Secondary | ICD-10-CM | POA: Diagnosis not present

## 2018-03-21 DIAGNOSIS — M961 Postlaminectomy syndrome, not elsewhere classified: Secondary | ICD-10-CM | POA: Diagnosis not present

## 2018-04-04 DIAGNOSIS — G894 Chronic pain syndrome: Secondary | ICD-10-CM | POA: Diagnosis not present

## 2018-04-04 DIAGNOSIS — M961 Postlaminectomy syndrome, not elsewhere classified: Secondary | ICD-10-CM | POA: Diagnosis not present

## 2018-04-04 DIAGNOSIS — M5137 Other intervertebral disc degeneration, lumbosacral region: Secondary | ICD-10-CM | POA: Diagnosis not present

## 2018-04-06 DIAGNOSIS — R52 Pain, unspecified: Secondary | ICD-10-CM | POA: Diagnosis not present

## 2018-04-06 DIAGNOSIS — M545 Low back pain: Secondary | ICD-10-CM | POA: Diagnosis not present

## 2018-04-06 DIAGNOSIS — M5137 Other intervertebral disc degeneration, lumbosacral region: Secondary | ICD-10-CM | POA: Diagnosis not present

## 2018-04-06 DIAGNOSIS — G894 Chronic pain syndrome: Secondary | ICD-10-CM | POA: Diagnosis not present

## 2018-04-06 DIAGNOSIS — M6080 Other myositis, unspecified site: Secondary | ICD-10-CM | POA: Diagnosis not present

## 2018-04-15 ENCOUNTER — Encounter: Payer: Self-pay | Admitting: Family Medicine

## 2018-04-27 DIAGNOSIS — M545 Low back pain: Secondary | ICD-10-CM | POA: Diagnosis not present

## 2018-04-27 DIAGNOSIS — M5417 Radiculopathy, lumbosacral region: Secondary | ICD-10-CM | POA: Diagnosis not present

## 2018-04-27 DIAGNOSIS — M961 Postlaminectomy syndrome, not elsewhere classified: Secondary | ICD-10-CM | POA: Diagnosis not present

## 2018-04-28 ENCOUNTER — Other Ambulatory Visit: Payer: Self-pay | Admitting: Family Medicine

## 2018-05-02 DIAGNOSIS — M5417 Radiculopathy, lumbosacral region: Secondary | ICD-10-CM | POA: Diagnosis not present

## 2018-05-02 DIAGNOSIS — M5137 Other intervertebral disc degeneration, lumbosacral region: Secondary | ICD-10-CM | POA: Diagnosis not present

## 2018-05-02 DIAGNOSIS — M961 Postlaminectomy syndrome, not elsewhere classified: Secondary | ICD-10-CM | POA: Diagnosis not present

## 2018-05-16 DIAGNOSIS — M5137 Other intervertebral disc degeneration, lumbosacral region: Secondary | ICD-10-CM | POA: Diagnosis not present

## 2018-05-16 DIAGNOSIS — M5417 Radiculopathy, lumbosacral region: Secondary | ICD-10-CM | POA: Diagnosis not present

## 2018-05-16 DIAGNOSIS — M961 Postlaminectomy syndrome, not elsewhere classified: Secondary | ICD-10-CM | POA: Diagnosis not present

## 2018-05-17 DIAGNOSIS — N951 Menopausal and female climacteric states: Secondary | ICD-10-CM | POA: Diagnosis not present

## 2018-05-17 DIAGNOSIS — E038 Other specified hypothyroidism: Secondary | ICD-10-CM | POA: Diagnosis not present

## 2018-05-17 DIAGNOSIS — Z79899 Other long term (current) drug therapy: Secondary | ICD-10-CM | POA: Diagnosis not present

## 2018-05-26 DIAGNOSIS — E291 Testicular hypofunction: Secondary | ICD-10-CM | POA: Diagnosis not present

## 2018-05-26 DIAGNOSIS — E559 Vitamin D deficiency, unspecified: Secondary | ICD-10-CM | POA: Diagnosis not present

## 2018-05-26 DIAGNOSIS — E038 Other specified hypothyroidism: Secondary | ICD-10-CM | POA: Diagnosis not present

## 2018-06-17 ENCOUNTER — Other Ambulatory Visit: Payer: Self-pay | Admitting: Family Medicine

## 2018-08-25 ENCOUNTER — Other Ambulatory Visit: Payer: Self-pay | Admitting: Family Medicine

## 2018-08-25 DIAGNOSIS — E782 Mixed hyperlipidemia: Secondary | ICD-10-CM

## 2018-09-15 DIAGNOSIS — R1310 Dysphagia, unspecified: Secondary | ICD-10-CM | POA: Diagnosis not present

## 2018-09-15 DIAGNOSIS — K219 Gastro-esophageal reflux disease without esophagitis: Secondary | ICD-10-CM | POA: Diagnosis not present

## 2018-09-15 DIAGNOSIS — Z1211 Encounter for screening for malignant neoplasm of colon: Secondary | ICD-10-CM | POA: Diagnosis not present

## 2018-10-10 DIAGNOSIS — K2930 Chronic superficial gastritis without bleeding: Secondary | ICD-10-CM | POA: Diagnosis not present

## 2018-10-10 DIAGNOSIS — R12 Heartburn: Secondary | ICD-10-CM | POA: Diagnosis not present

## 2018-10-10 DIAGNOSIS — K2900 Acute gastritis without bleeding: Secondary | ICD-10-CM | POA: Diagnosis not present

## 2018-10-10 DIAGNOSIS — K219 Gastro-esophageal reflux disease without esophagitis: Secondary | ICD-10-CM | POA: Diagnosis not present

## 2018-10-10 DIAGNOSIS — Z1211 Encounter for screening for malignant neoplasm of colon: Secondary | ICD-10-CM | POA: Diagnosis not present

## 2018-10-10 DIAGNOSIS — D122 Benign neoplasm of ascending colon: Secondary | ICD-10-CM | POA: Diagnosis not present

## 2018-10-19 ENCOUNTER — Other Ambulatory Visit: Payer: Self-pay | Admitting: Family Medicine

## 2018-10-19 DIAGNOSIS — G43819 Other migraine, intractable, without status migrainosus: Secondary | ICD-10-CM

## 2018-11-14 ENCOUNTER — Other Ambulatory Visit: Payer: Self-pay | Admitting: *Deleted

## 2018-11-14 DIAGNOSIS — G43819 Other migraine, intractable, without status migrainosus: Secondary | ICD-10-CM

## 2018-11-14 MED ORDER — DOXEPIN HCL 50 MG PO CAPS: ORAL_CAPSULE | ORAL | 11 refills | Status: DC

## 2018-11-21 ENCOUNTER — Other Ambulatory Visit: Payer: Self-pay | Admitting: Family Medicine

## 2018-12-06 ENCOUNTER — Other Ambulatory Visit: Payer: Self-pay

## 2018-12-06 ENCOUNTER — Encounter: Payer: Self-pay | Admitting: Family Medicine

## 2018-12-06 ENCOUNTER — Ambulatory Visit (INDEPENDENT_AMBULATORY_CARE_PROVIDER_SITE_OTHER): Payer: Commercial Managed Care - PPO | Admitting: Family Medicine

## 2018-12-06 VITALS — BP 120/72 | HR 59

## 2018-12-06 DIAGNOSIS — M5416 Radiculopathy, lumbar region: Secondary | ICD-10-CM | POA: Diagnosis not present

## 2018-12-06 DIAGNOSIS — E782 Mixed hyperlipidemia: Secondary | ICD-10-CM | POA: Diagnosis not present

## 2018-12-06 DIAGNOSIS — I10 Essential (primary) hypertension: Secondary | ICD-10-CM | POA: Diagnosis not present

## 2018-12-06 DIAGNOSIS — Z0000 Encounter for general adult medical examination without abnormal findings: Secondary | ICD-10-CM | POA: Diagnosis not present

## 2018-12-06 NOTE — Patient Instructions (Signed)
Thank you for coming in to see [place] today. Please see below to review our plan for today's visit.  1.  Follow-up with [facility] as needed.  Your handicap placard will expire in 6 months at which point we can renew. 2.  Continue taking your lisinopril in the evening before bedtime.  I will call you if there are any abnormalities your labs within 48 hours otherwise you should receive results in the mail. 3.  Follow-up in 1 year or sooner if needed.  Please call the clinic at [phone] if your symptoms worsen or you have any concerns. It was our pleasure to serve you.  Harriet Butte, [place], PGY-3

## 2018-12-06 NOTE — Assessment & Plan Note (Signed)
Chronic.  Well-controlled.  Non-smoker. - Advised to transition lisinopril 20 mg to evening dose - Checking CBC, CMAC, lipid panel

## 2018-12-06 NOTE — Assessment & Plan Note (Signed)
Chronic.  Primary prevention.  Currently on moderate intensity statin. - Continue Lipitor 20 mg daily

## 2018-12-06 NOTE — Progress Notes (Signed)
   Subjective   Patient ID: Jose Shea.    DOB: [birth year], 63 y.o. male   MRN: [id number]  CC: "Physical exam"  HPI: Jose Shea. is a 63 y.o. male who presents to clinic today for the following:  Chronic low back pain: Jose Shea has a significant history of multiple areas of arthritis including his C-spine, lumbar, and hip.  He is followed by [facility] for management of his low back pain with a history of spinal injections.  Patient reports 50% improvement and has not been seen since October but plans to follow-up if his pain worsens.  He does have a prescription for Tylenol 3 which is rarely used.  He currently uses naproxen on as-needed basis.  Hypertension: Currently on lisinopril 20 mg daily.  Non-smoker.  Reports good adherence.  Denies history of chest pain, shortness of breath, lower extremity swelling.  Due for annual labs.  Hyperlipidemia: Takes Lipitor 20 mg daily with good adherence.  No history of cardiac or cerebrovascular disease.  ROS: see HPI for pertinent.  [place]: Reviewed. Smoking status reviewed. Medications reviewed.  Objective   BP 120/72   Pulse (!) 59   SpO2 98%  Vitals and nursing note reviewed.  General: well nourished, well developed, NAD with non-toxic appearance HEENT: normocephalic, atraumatic, moist mucous membranes Neck: supple, non-tender without lymphadenopathy Cardiovascular: regular rate and rhythm without murmurs, rubs, or gallops Lungs: clear to auscultation bilaterally with normal work of breathing Abdomen: soft, non-tender, non-distended, normoactive bowel sounds Skin: warm, dry, no rashes or lesions, cap refill < 2 seconds Extremities: warm and well perfused, normal tone, no edema  Assessment & Plan   Primary hypertension Chronic.  Well-controlled.  Non-smoker. - Advised to transition lisinopril 20 mg to evening dose - Checking CBC, CMAC, lipid panel  Mixed hyperlipidemia Chronic.  Primary prevention.   Currently on moderate intensity statin. - Continue Lipitor 20 mg daily  Lumbar back pain with radiculopathy affecting left lower extremity Chronic with adequate control.  Followed by pain specialist. - Given handicap placard for 6 months - Instructed to follow-up with pain specialist as needed  Orders Placed This Encounter  Procedures  . Lipid Panel    Order Specific Question:   Has the patient fasted?    Answer:   No  . Comprehensive metabolic panel    Order Specific Question:   Has the patient fasted?    Answer:   No  . CBC   No orders of the defined types were placed in this encounter.   Harriet Butte, [place], PGY-3 12/06/2018, 11:09 AM

## 2018-12-06 NOTE — Assessment & Plan Note (Signed)
Chronic with adequate control.  Followed by pain specialist. - Given handicap placard for 6 months - Instructed to follow-up with pain specialist as needed

## 2018-12-07 ENCOUNTER — Encounter: Payer: Self-pay | Admitting: Family Medicine

## 2018-12-07 LAB — LIPID PANEL
Chol/HDL Ratio: 3.6 ratio (ref 0.0–5.0)
Cholesterol, Total: 123 mg/dL (ref 100–199)
HDL: 34 mg/dL — ABNORMAL LOW (ref >39)
LDL Calculated: 62 mg/dL (ref 0–99)
Triglycerides: 133 mg/dL (ref 0–149)
VLDL Cholesterol Cal: 27 mg/dL (ref 5–40)

## 2018-12-07 LAB — COMPREHENSIVE METABOLIC PANEL
ALT: 32 IU/L (ref 0–44)
AST: 27 IU/L (ref 0–40)
Albumin/Globulin Ratio: 2.5 — ABNORMAL HIGH (ref 1.2–2.2)
Albumin: 4.5 g/dL (ref 3.8–4.9)
Alkaline Phosphatase: 65 IU/L (ref 39–117)
BUN/Creatinine Ratio: 22 (ref 10–24)
BUN: 23 mg/dL (ref 8–27)
Bilirubin Total: 0.4 mg/dL (ref 0.0–1.2)
CO2: 24 mmol/L (ref 20–29)
Calcium: 9.2 mg/dL (ref 8.6–10.2)
Chloride: 101 mmol/L (ref 96–106)
Creatinine, Ser: 1.04 mg/dL (ref 0.76–1.27)
GFR calc Af Amer: 90 mL/min/{1.73_m2} (ref >59)
GFR calc non Af Amer: 78 mL/min/{1.73_m2} (ref >59)
Globulin, Total: 1.8 g/dL (ref 1.5–4.5)
Glucose: 94 mg/dL (ref 65–99)
Potassium: 4.8 mmol/L (ref 3.5–5.2)
Sodium: 141 mmol/L (ref 134–144)
Total Protein: 6.3 g/dL (ref 6.0–8.5)

## 2018-12-07 LAB — CBC
Hematocrit: 47.5 % (ref 37.5–51.0)
Hemoglobin: 15.8 g/dL (ref 13.0–17.7)
MCH: 28.9 pg (ref 26.6–33.0)
MCHC: 33.3 g/dL (ref 31.5–35.7)
MCV: 87 fL (ref 79–97)
Platelets: 173 10*3/uL (ref 150–450)
RBC: 5.46 x10E6/uL (ref 4.14–5.80)
RDW: 12.8 % (ref 11.6–15.4)
WBC: 5.7 10*3/uL (ref 3.4–10.8)

## 2018-12-08 ENCOUNTER — Encounter: Payer: Self-pay | Admitting: Family Medicine

## 2018-12-15 ENCOUNTER — Other Ambulatory Visit: Payer: Self-pay | Admitting: Family Medicine

## 2018-12-26 ENCOUNTER — Other Ambulatory Visit: Payer: Self-pay | Admitting: Family Medicine

## 2019-01-03 ENCOUNTER — Encounter: Payer: Self-pay | Admitting: Family Medicine

## 2019-01-09 ENCOUNTER — Other Ambulatory Visit: Payer: Self-pay | Admitting: Family Medicine

## 2019-01-18 ENCOUNTER — Other Ambulatory Visit: Payer: Self-pay

## 2019-01-18 MED ORDER — NARATRIPTAN HCL 2.5 MG PO TABS: ORAL_TABLET | ORAL | 6 refills | Status: DC

## 2019-02-06 ENCOUNTER — Other Ambulatory Visit: Payer: Self-pay | Admitting: *Deleted

## 2019-02-06 MED ORDER — LISINOPRIL 10 MG PO TABS: 20.0000 mg | ORAL_TABLET | Freq: Every day | ORAL | 1 refills | Status: DC

## 2019-03-01 ENCOUNTER — Other Ambulatory Visit: Payer: Self-pay | Admitting: Family Medicine

## 2019-03-07 ENCOUNTER — Other Ambulatory Visit: Payer: Self-pay | Admitting: *Deleted

## 2019-03-09 MED ORDER — METHOCARBAMOL 750 MG PO TABS: 750.0000 mg | ORAL_TABLET | Freq: Two times a day (BID) | ORAL | 0 refills | Status: DC | PRN

## 2019-03-09 NOTE — Telephone Encounter (Signed)
2nd request from pharmacy. Jose Shea, CMA  

## 2019-03-29 ENCOUNTER — Other Ambulatory Visit: Payer: Self-pay

## 2019-03-29 MED ORDER — LISINOPRIL 10 MG PO TABS: 20.0000 mg | ORAL_TABLET | Freq: Every day | ORAL | 2 refills | Status: DC

## 2019-04-18 ENCOUNTER — Telehealth: Payer: Self-pay | Admitting: Family Medicine

## 2019-04-18 NOTE — Telephone Encounter (Signed)
Pt would like to have another handicap sticker. Dr. Yisroel Ramming saw him on 05/12 and the sticker is only good for 6 months.

## 2019-04-23 ENCOUNTER — Other Ambulatory Visit: Payer: Self-pay | Admitting: Family Medicine

## 2019-04-24 NOTE — Telephone Encounter (Signed)
I will be happy to replace based on Dr. Ericka Pontiff note. The patient will need to bring the paperwork to clinic or fax it in. He does not need an appointment for this.  Guadalupe Dawn MD PGY-3 Family Medicine Resident

## 2019-05-16 ENCOUNTER — Other Ambulatory Visit: Payer: Self-pay | Admitting: Physical Medicine and Rehabilitation

## 2019-05-16 DIAGNOSIS — M79605 Pain in left leg: Secondary | ICD-10-CM

## 2019-05-16 DIAGNOSIS — M545 Low back pain: Secondary | ICD-10-CM

## 2019-05-22 ENCOUNTER — Other Ambulatory Visit: Payer: Self-pay | Admitting: Family Medicine

## 2019-06-03 ENCOUNTER — Ambulatory Visit
Admission: RE | Admit: 2019-06-03 | Discharge: 2019-06-03 | Disposition: A | Discharge status: Home / Self Care | Payer: Commercial Managed Care - PPO | Source: Ambulatory Visit | Attending: Physical Medicine and Rehabilitation | Admitting: Physical Medicine and Rehabilitation

## 2019-06-03 ENCOUNTER — Other Ambulatory Visit: Payer: Self-pay

## 2019-06-03 DIAGNOSIS — M79605 Pain in left leg: Secondary | ICD-10-CM

## 2019-06-03 DIAGNOSIS — M545 Low back pain: Secondary | ICD-10-CM

## 2019-07-20 ENCOUNTER — Other Ambulatory Visit: Payer: Self-pay | Admitting: Family Medicine

## 2019-08-20 ENCOUNTER — Other Ambulatory Visit: Payer: Self-pay | Admitting: Family Medicine

## 2019-09-06 ENCOUNTER — Ambulatory Visit (INDEPENDENT_AMBULATORY_CARE_PROVIDER_SITE_OTHER): Payer: Medicare Other | Admitting: Family Medicine

## 2019-09-06 ENCOUNTER — Other Ambulatory Visit: Payer: Self-pay

## 2019-09-06 ENCOUNTER — Encounter: Payer: Self-pay | Admitting: Family Medicine

## 2019-09-06 VITALS — BP 114/72 | HR 53 | Wt 166.8 lb

## 2019-09-06 DIAGNOSIS — M5416 Radiculopathy, lumbar region: Secondary | ICD-10-CM | POA: Diagnosis not present

## 2019-09-06 DIAGNOSIS — I10 Essential (primary) hypertension: Secondary | ICD-10-CM

## 2019-09-06 DIAGNOSIS — Z0000 Encounter for general adult medical examination without abnormal findings: Secondary | ICD-10-CM

## 2019-09-06 DIAGNOSIS — G43819 Other migraine, intractable, without status migrainosus: Secondary | ICD-10-CM

## 2019-09-06 MED ORDER — CYCLOBENZAPRINE HCL 10 MG PO TABS: 10.0000 mg | ORAL_TABLET | Freq: Three times a day (TID) | ORAL | 0 refills | Status: DC | PRN

## 2019-09-06 MED ORDER — ZOSTER VAC RECOMB ADJUVANTED 50 MCG/0.5ML IM SUSR: 0.5000 mL | Freq: Once | INTRAMUSCULAR | 0 refills | Status: AC

## 2019-09-06 MED ORDER — PROPRANOLOL HCL ER BEADS 80 MG PO CP24: 160.0000 mg | ORAL_CAPSULE | Freq: Every day | ORAL | 0 refills | Status: DC

## 2019-09-06 NOTE — Patient Instructions (Signed)
It was great seeing you today!  I switch your current muscle relaxer to Flexeril.  I gave you a prescription for a shingles vaccine.  Please let me know about the nadolol, I can switch this to propanolol if needed.  We are going to check your kidney and liver function today.  Please let me know if anything else pops up.

## 2019-09-07 ENCOUNTER — Encounter: Payer: Self-pay | Admitting: Family Medicine

## 2019-09-07 DIAGNOSIS — Z0000 Encounter for general adult medical examination without abnormal findings: Secondary | ICD-10-CM | POA: Insufficient documentation

## 2019-09-07 NOTE — Assessment & Plan Note (Signed)
Switched his robaxin to flexeril based on insurance formulary. Follow up with pain management as needed. Can bring his handicap placard when expires.

## 2019-09-07 NOTE — Assessment & Plan Note (Signed)
Up to date on all health maintenance items aside from shingles vaccine. Gave him prescription.

## 2019-09-07 NOTE — Assessment & Plan Note (Signed)
Well controlled. Takes doxepin 50mg  daily. Nadolol 160mg  daily for ppx and naratriptan 2.5mg  for acute exacerbation. Switched nadolol 160mg  to propranolol xr 160mg  daily for insurance formulary.

## 2019-09-07 NOTE — Progress Notes (Signed)
   CHIEF COMPLAINT / HPI: 64 year old male who presents for medication changes. He recently changed his insurance and was asked to get his nadolol and muscle relaxer changed to meet formulary. He takes the nadolol for migraine prophylaxis. He takes flexeril for muscle spasms in his back area. He is up to date on all health maintenance items except for the shingles vaccine. He is interested in the shingles vaccine.   The patient also complains of trouble staying asleep.He does snore loudly per people that stay with him. He wakes up with a headache on most days and feels fatigued frequently.  PERTINENT  PMH / PSH: chronic back s/p cervical and lumbar fusion    OBJECTIVE: BP 114/72   Pulse (!) 53   Wt 166 lb 12.8 oz (75.7 kg)   SpO2 98%   BMI 24.63 kg/m   Gen: very pleasant 64 year old mal, no acute distress, comfortable HEENT: perrla, no cervical lymphadenopathy CV: rrr, no m/r/g. Skin warm and dry Resp: lungs clear to auscultation bilaterally, no accessory muscle use Neuro: Alert and oriented, Speech clear, No gross deficits   ASSESSMENT / PLAN:  Primary hypertension bp well controlled at 114/72. On lisinopril 10mg  daily. Also likely gets some benefit from the nadolol. Will get bmp to assess k and cr. Switching nadolol to propranolol due to insurance formulary.  Migraine Well controlled. Takes doxepin 50mg  daily. Nadolol 160mg  daily for ppx and naratriptan 2.5mg  for acute exacerbation. Switched nadolol 160mg  to propranolol xr 160mg  daily for insurance formulary.  Healthcare maintenance Up to date on all health maintenance items aside from shingles vaccine. Gave him prescription.  Lumbar back pain with radiculopathy affecting left lower extremity Switched his robaxin to flexeril based on insurance formulary. Follow up with pain management as needed. Can bring his handicap placard when expires.     Guadalupe Dawn MD PGY-3 Family Medicine Resident [place]

## 2019-09-07 NOTE — Assessment & Plan Note (Signed)
bp well controlled at 114/72. On lisinopril 10mg  daily. Also likely gets some benefit from the nadolol. Will get bmp to assess k and cr. Switching nadolol to propranolol due to insurance formulary.

## 2019-10-02 ENCOUNTER — Telehealth: Payer: Self-pay

## 2019-10-02 NOTE — Telephone Encounter (Signed)
Patient calls nurse line stating he is getting his 2nd Shingles vaccines in a few weeks. Patient stated the pharmacist told him to wait to get his covid vaccine ~1 month after the 2nd shingles shot. Patient is wanting to know if this is true, because he knows of a place where he can get his covid vaccine in the next few days. Please advise.

## 2019-10-02 NOTE — Telephone Encounter (Signed)
Called patient and discussed with him. He only received the first dose of the shingles vaccine roughly 1 month ago. I recommended getting the entire covid vaccine series as the priority. The CDC recommendation is to wait 2 weeks before getting any other vaccines after the covid vaccine series. He can get the second dose of the shingles 2 weeks after his covid vaccine series has completed.   Guadalupe Dawn MD PGY-3 Family Medicine Resident

## 2019-10-16 ENCOUNTER — Other Ambulatory Visit: Payer: Self-pay | Admitting: *Deleted

## 2019-10-16 DIAGNOSIS — G43819 Other migraine, intractable, without status migrainosus: Secondary | ICD-10-CM

## 2019-10-16 MED ORDER — DOXEPIN HCL 50 MG PO CAPS: ORAL_CAPSULE | ORAL | 11 refills | Status: DC

## 2019-10-23 ENCOUNTER — Other Ambulatory Visit: Payer: Self-pay | Admitting: *Deleted

## 2019-10-23 MED ORDER — NARATRIPTAN HCL 2.5 MG PO TABS: ORAL_TABLET | ORAL | 6 refills | Status: DC

## 2019-11-20 ENCOUNTER — Telehealth: Payer: Self-pay | Admitting: Family Medicine

## 2019-11-20 NOTE — Telephone Encounter (Signed)
Reviewed form and placed in PCP's box for completion.  .Brandonlee Navis R Starleen Trussell, CMA  

## 2019-11-20 NOTE — Telephone Encounter (Signed)
Pt. walked in and submitted [place] DM[employer] parking placard form to be completed.  Last appt 09/06/19 form placed in red team folder

## 2019-11-24 NOTE — Telephone Encounter (Signed)
Called patient and informed form was ready to be picked up. Copy placed in batch scanning. Original placed in front desk filing cabinet.   Talbot Grumbling, RN

## 2019-11-24 NOTE — Telephone Encounter (Signed)
Filled out form and placed in nurse's box  Guadalupe Dawn MD PGY-3 Family Medicine Resident

## 2019-11-30 ENCOUNTER — Other Ambulatory Visit: Payer: Self-pay | Admitting: Family Medicine

## 2019-12-11 ENCOUNTER — Telehealth: Payer: Self-pay | Admitting: Family Medicine

## 2019-12-11 NOTE — Telephone Encounter (Signed)
Pt would like Dr. Kris Mouton to put orders in for labs. He does have insurance now and was told to get labs once he had insurance. He has an appointment for this Wednesday 12/14/19 jw

## 2019-12-12 ENCOUNTER — Other Ambulatory Visit: Payer: Self-pay | Admitting: Family Medicine

## 2019-12-12 DIAGNOSIS — Z8719 Personal history of other diseases of the digestive system: Secondary | ICD-10-CM

## 2019-12-12 DIAGNOSIS — E785 Hyperlipidemia, unspecified: Secondary | ICD-10-CM

## 2019-12-12 DIAGNOSIS — I10 Essential (primary) hypertension: Secondary | ICD-10-CM

## 2019-12-12 DIAGNOSIS — G43819 Other migraine, intractable, without status migrainosus: Secondary | ICD-10-CM

## 2019-12-12 NOTE — Addendum Note (Signed)
Addended by: Pauletta Browns on: 12/12/2019 05:00 PM   Modules accepted: Orders

## 2019-12-12 NOTE — Telephone Encounter (Signed)
Placed orders for cbc, cmp, lipid panel for lab visit on 5/19  Guadalupe Dawn MD PGY-3 Family Medicine Resident

## 2019-12-13 ENCOUNTER — Other Ambulatory Visit: Payer: Self-pay

## 2019-12-13 ENCOUNTER — Other Ambulatory Visit: Payer: Medicare Other

## 2019-12-13 DIAGNOSIS — I10 Essential (primary) hypertension: Secondary | ICD-10-CM | POA: Diagnosis not present

## 2019-12-13 DIAGNOSIS — Z8719 Personal history of other diseases of the digestive system: Secondary | ICD-10-CM | POA: Diagnosis not present

## 2019-12-13 DIAGNOSIS — E785 Hyperlipidemia, unspecified: Secondary | ICD-10-CM | POA: Diagnosis not present

## 2019-12-14 LAB — LIPID PANEL
Chol/HDL Ratio: 3.3 ratio (ref 0.0–5.0)
Cholesterol, Total: 114 mg/dL (ref 100–199)
HDL: 35 mg/dL — ABNORMAL LOW (ref >39)
LDL Chol Calc (NIH): 56 mg/dL (ref 0–99)
Triglycerides: 128 mg/dL (ref 0–149)
VLDL Cholesterol Cal: 23 mg/dL (ref 5–40)

## 2019-12-14 LAB — COMPREHENSIVE METABOLIC PANEL
ALT: 33 IU/L (ref 0–44)
AST: 29 IU/L (ref 0–40)
Albumin/Globulin Ratio: 2.3 — ABNORMAL HIGH (ref 1.2–2.2)
Albumin: 4.3 g/dL (ref 3.8–4.8)
Alkaline Phosphatase: 66 IU/L (ref 48–121)
BUN/Creatinine Ratio: 16 (ref 10–24)
BUN: 18 mg/dL (ref 8–27)
Bilirubin Total: 0.3 mg/dL (ref 0.0–1.2)
CO2: 25 mmol/L (ref 20–29)
Calcium: 9.1 mg/dL (ref 8.6–10.2)
Chloride: 104 mmol/L (ref 96–106)
Creatinine, Ser: 1.11 mg/dL (ref 0.76–1.27)
GFR calc Af Amer: 82 mL/min/{1.73_m2} (ref >59)
GFR calc non Af Amer: 71 mL/min/{1.73_m2} (ref >59)
Globulin, Total: 1.9 g/dL (ref 1.5–4.5)
Glucose: 100 mg/dL — ABNORMAL HIGH (ref 65–99)
Potassium: 4.6 mmol/L (ref 3.5–5.2)
Sodium: 142 mmol/L (ref 134–144)
Total Protein: 6.2 g/dL (ref 6.0–8.5)

## 2019-12-14 LAB — CBC WITH DIFFERENTIAL/PLATELET
Basophils Absolute: 0 10*3/uL (ref 0.0–0.2)
Basos: 1 %
EOS (ABSOLUTE): 0.2 10*3/uL (ref 0.0–0.4)
Eos: 4 %
Hematocrit: 46.9 % (ref 37.5–51.0)
Hemoglobin: 15.6 g/dL (ref 13.0–17.7)
Immature Grans (Abs): 0 10*3/uL (ref 0.0–0.1)
Immature Granulocytes: 0 %
Lymphocytes Absolute: 1 10*3/uL (ref 0.7–3.1)
Lymphs: 18 %
MCH: 29.9 pg (ref 26.6–33.0)
MCHC: 33.3 g/dL (ref 31.5–35.7)
MCV: 90 fL (ref 79–97)
Monocytes Absolute: 0.7 10*3/uL (ref 0.1–0.9)
Monocytes: 12 %
Neutrophils Absolute: 3.6 10*3/uL (ref 1.4–7.0)
Neutrophils: 65 %
Platelets: 159 10*3/uL (ref 150–450)
RBC: 5.22 x10E6/uL (ref 4.14–5.80)
RDW: 12 % (ref 11.6–15.4)
WBC: 5.6 10*3/uL (ref 3.4–10.8)

## 2020-01-10 ENCOUNTER — Other Ambulatory Visit: Payer: Self-pay | Admitting: *Deleted

## 2020-01-11 MED ORDER — CYCLOBENZAPRINE HCL 10 MG PO TABS: 10.0000 mg | ORAL_TABLET | Freq: Three times a day (TID) | ORAL | 0 refills | Status: DC | PRN

## 2020-02-12 ENCOUNTER — Other Ambulatory Visit: Payer: Self-pay | Admitting: Family Medicine

## 2020-02-24 ENCOUNTER — Other Ambulatory Visit: Payer: Self-pay | Admitting: Family Medicine

## 2020-03-08 ENCOUNTER — Other Ambulatory Visit: Payer: Self-pay | Admitting: Family Medicine

## 2020-03-11 ENCOUNTER — Other Ambulatory Visit: Payer: Self-pay | Admitting: Family Medicine

## 2020-04-09 ENCOUNTER — Other Ambulatory Visit: Payer: Self-pay

## 2020-04-09 ENCOUNTER — Ambulatory Visit (INDEPENDENT_AMBULATORY_CARE_PROVIDER_SITE_OTHER): Payer: Medicare Other | Admitting: Family Medicine

## 2020-04-09 VITALS — BP 135/65 | HR 66 | Ht 68.5 in | Wt 160.0 lb

## 2020-04-09 DIAGNOSIS — R310 Gross hematuria: Secondary | ICD-10-CM | POA: Diagnosis not present

## 2020-04-09 NOTE — Assessment & Plan Note (Addendum)
Pt symptoms seem most consistent with renal stone that has since passed. Differential includes UTI. Although possible, unlikely to be due to malignancy given the sudden onset and resolution of symptoms.  Given that he has no symptoms today, we will forego further workup at this time.  Urine dipstick showed hematuria. Will need to get a followup urinalysis in 1 month to make sure hematuria has resolved.  Pt advised to make another appointment if his symptoms return.

## 2020-04-09 NOTE — Patient Instructions (Addendum)
You most likely passed a kidney stone.  These can recur sometimes, so if you have symptoms again, let us know.    I will call you if the results of your urinalysis are abnormal.  Otherwise, follow up as needed.    Have a great day,   Clemetine Marker, MD

## 2020-04-09 NOTE — Progress Notes (Signed)
    SUBJECTIVE:   CHIEF COMPLAINT / HPI:   uti symptoms: pt states he 'didn't feel right' Saturday. He felt like he had the flu but did not have any fever.  He had some 'tingling' sensation with urination, which he states was not a 'burning', which reminded him of when he a UTI in 1990.  The patient drank 'tons' of water Saturday and Sunday.  Saturday night he felt an abdominal 'pressure' and was having difficulty urinating.  Sunday he urinated and felt like he 'passed' something in his urine, which he states was more like tissue than a stone.  He had visible blood in his urine that time, but has not had any since then.  He is now urinating normally and has no abdominal pain.  The patient denies a history of BPH symptoms or incontinence.  He had a paternal uncle with bladder cancer and a paternal uncle with prostate cancer.     PERTINENT  PMH / PSH: HTN, RLS, migraine  OBJECTIVE:   BP 135/65   Pulse 66   Ht 5' 8.5" (1.74 m)   Wt 160 lb (72.6 kg)   SpO2 98%   BMI 23.97 kg/m   General: alert, oriented.  No acute distress.   CV: RRR.   Abd: soft, nontender.  Normal bowel sounds.  Nondistended.  No CVA tenderness.   ASSESSMENT/PLAN:   Gross hematuria Pt symptoms seem most consistent with renal stone that has since passed. Differential includes UTI. Although possible, unlikely to be due to malignancy given the sudden onset and resolution of symptoms.  Given that he has no symptoms today, we will forego further workup at this time.  Urine dipstick showed hematuria. Will need to get a followup urinalysis in 1 month to make sure hematuria has resolved.  Pt advised to make another appointment if his symptoms return.       Benay Pike, MD [place]

## 2020-04-10 LAB — POCT URINALYSIS DIP (MANUAL ENTRY)
Bilirubin, UA: NEGATIVE
Glucose, UA: NEGATIVE mg/dL
Ketones, POC UA: NEGATIVE mg/dL
Nitrite, UA: NEGATIVE
Protein Ur, POC: 30 mg/dL — AB
Spec Grav, UA: 1.02 (ref 1.010–1.025)
Urobilinogen, UA: 0.2 E.U./dL
pH, UA: 5.5 (ref 5.0–8.0)

## 2020-04-10 LAB — POCT UA - MICROSCOPIC ONLY: RBC, urine, microscopic: >20

## 2020-04-10 NOTE — Addendum Note (Signed)
Addended by: [place] Pink on: 04/10/2020 09:41 AM   Modules accepted: Orders

## 2020-04-12 ENCOUNTER — Other Ambulatory Visit: Payer: Self-pay | Admitting: Family Medicine

## 2020-04-19 ENCOUNTER — Other Ambulatory Visit: Payer: Self-pay | Admitting: Family Medicine

## 2020-05-13 ENCOUNTER — Other Ambulatory Visit: Payer: Self-pay | Admitting: Family Medicine

## 2020-05-15 ENCOUNTER — Other Ambulatory Visit: Payer: Medicare Other

## 2020-05-15 DIAGNOSIS — Z20822 Contact with and (suspected) exposure to covid-19: Secondary | ICD-10-CM | POA: Diagnosis not present

## 2020-05-16 LAB — SARS-COV-2, NAA 2 DAY TAT

## 2020-05-16 LAB — NOVEL CORONAVIRUS, NAA: SARS-CoV-2, NAA: NOT DETECTED

## 2020-06-04 ENCOUNTER — Other Ambulatory Visit: Payer: Self-pay | Admitting: Family Medicine

## 2020-06-08 ENCOUNTER — Ambulatory Visit: Payer: Medicare Other | Attending: Internal Medicine

## 2020-06-08 DIAGNOSIS — Z23 Encounter for immunization: Secondary | ICD-10-CM

## 2020-06-08 NOTE — Progress Notes (Signed)
   [id number]  Name:  Jose Shea.    MRN: [id number] DOB: 1956-03-02  06/08/2020  Jose Shea was observed post Covid-19 immunization for 15 minutes without incident. He was provided with Vaccine Information Sheet and instruction to access the V-Safe system.   Jose Shea was instructed to call 911 with any severe reactions post vaccine: Marland Kitchen Difficulty breathing  . Swelling of face and throat  . A fast heartbeat  . A bad rash all over body  . Dizziness and weakness   Immunizations Administered    Name Date Dose VIS Date Route   Pfizer COVID-19 Vaccine 06/08/2020  4:37 PM 0.3 mL 05/15/2020 Intramuscular   Manufacturer: [place]   Lot: [id number]   [place]: [address]-8

## 2020-06-10 ENCOUNTER — Other Ambulatory Visit: Payer: Self-pay | Admitting: *Deleted

## 2020-06-10 MED ORDER — ROPINIROLE HCL 0.25 MG PO TABS: ORAL_TABLET | ORAL | 5 refills | Status: DC

## 2020-06-13 ENCOUNTER — Other Ambulatory Visit: Payer: Self-pay | Admitting: *Deleted

## 2020-07-04 ENCOUNTER — Other Ambulatory Visit: Payer: Self-pay | Admitting: Family Medicine

## 2020-07-22 ENCOUNTER — Other Ambulatory Visit: Payer: Self-pay | Admitting: Family Medicine

## 2020-08-20 ENCOUNTER — Other Ambulatory Visit: Payer: Self-pay | Admitting: Family Medicine

## 2020-09-12 ENCOUNTER — Encounter: Payer: Self-pay | Admitting: Family Medicine

## 2020-09-12 ENCOUNTER — Other Ambulatory Visit: Payer: Self-pay

## 2020-09-12 ENCOUNTER — Ambulatory Visit (INDEPENDENT_AMBULATORY_CARE_PROVIDER_SITE_OTHER): Payer: Medicare Other | Admitting: Family Medicine

## 2020-09-12 DIAGNOSIS — E782 Mixed hyperlipidemia: Secondary | ICD-10-CM | POA: Diagnosis not present

## 2020-09-12 DIAGNOSIS — M4802 Spinal stenosis, cervical region: Secondary | ICD-10-CM

## 2020-09-12 DIAGNOSIS — I10 Essential (primary) hypertension: Secondary | ICD-10-CM | POA: Diagnosis not present

## 2020-09-12 MED ORDER — ATORVASTATIN CALCIUM 20 MG PO TABS: 20.0000 mg | ORAL_TABLET | Freq: Every day | ORAL | 3 refills | Status: DC

## 2020-09-12 MED ORDER — CYCLOBENZAPRINE HCL 10 MG PO TABS: ORAL_TABLET | ORAL | 2 refills | Status: DC

## 2020-09-12 NOTE — Patient Instructions (Addendum)
It was good to see you today.  Thank you for coming in.  Your blood pressure looks great today.  You can come in to have your cholesterol checked as soon as May or you can wait until your next Physical in 1 year.  We would like to see you again in 1 year for your yearly exam, or sooner if needed.  Be Well, Dr Manus Rudd

## 2020-09-13 NOTE — Assessment & Plan Note (Signed)
Blood pressure well controled today at 120/78.   - Refill Atorvastatin 20 mg

## 2020-09-13 NOTE — Assessment & Plan Note (Signed)
Indicates pain has been well controlled lately and is no longer requiring any Opioid for pain management.  Indicates uses Flexeril once every 2-3 days. - Refill Flexeril

## 2020-09-13 NOTE — Progress Notes (Signed)
    SUBJECTIVE:   Chief compliant/HPI: annual examination  Jose Shea. is a 65 y.o. who presents today for an annual exam.  No complaints today.  Here for medication refill.  Has no other complaints at this time.  History tabs reviewed and updated.   OBJECTIVE:   BP 120/78   Pulse 63   Wt 169 lb (76.7 kg)   SpO2 96%   BMI 25.32 kg/m    Physical Exam Constitutional:      General: He is not in acute distress.    Appearance: He is not ill-appearing.  HENT:     Head: Normocephalic and atraumatic.     Mouth/Throat:     Mouth: Mucous membranes are moist.  Cardiovascular:     Rate and Rhythm: Normal rate and regular rhythm.     Pulses: Normal pulses.  Pulmonary:     Effort: Pulmonary effort is normal.     Breath sounds: Normal breath sounds.  Abdominal:     General: Abdomen is flat. Bowel sounds are normal. There is no distension.     Palpations: Abdomen is soft.     Tenderness: There is no abdominal tenderness.  Musculoskeletal:        General: No swelling.  Skin:    General: Skin is warm.  Neurological:     General: No focal deficit present.     Mental Status: He is alert.  Psychiatric:        Mood and Affect: Mood normal.        Behavior: Behavior normal.     ASSESSMENT/PLAN:   Primary hypertension Blood pressure well controled today at 120/78.   - Refill Atorvastatin 20 mg  Cervical stenosis of spine with radiculopathy Indicates pain has been well controlled lately and is no longer requiring any Opioid for pain management.  Indicates uses Flexeril once every 2-3 days. - Refill Flexeril    Annual Examination  See AVS for age appropriate recommendations.  PHQ score 9, reviewed and discussed.    Considered the following screening exams based upon USPSTF recommendations: Diabetes screening: Not indicates Screening for elevated cholesterol: Not due for repeat lipid lab until May, been well controlled so told patient could wait until next yearly exam  given good control.  Colorectal cancer screening: up to date on screening for CRC.     Follow up in 1 year or sooner if indicated.    Delora Fuel, MD [place]

## 2020-11-07 ENCOUNTER — Other Ambulatory Visit: Payer: Self-pay | Admitting: Family Medicine

## 2020-11-17 ENCOUNTER — Other Ambulatory Visit: Payer: Self-pay | Admitting: Family Medicine

## 2020-12-07 ENCOUNTER — Other Ambulatory Visit: Payer: Self-pay | Admitting: Family Medicine

## 2020-12-20 DIAGNOSIS — H2513 Age-related nuclear cataract, bilateral: Secondary | ICD-10-CM | POA: Diagnosis not present

## 2020-12-20 DIAGNOSIS — H5203 Hypermetropia, bilateral: Secondary | ICD-10-CM | POA: Diagnosis not present

## 2020-12-20 DIAGNOSIS — H524 Presbyopia: Secondary | ICD-10-CM | POA: Diagnosis not present

## 2020-12-20 DIAGNOSIS — H52203 Unspecified astigmatism, bilateral: Secondary | ICD-10-CM | POA: Diagnosis not present

## 2020-12-25 ENCOUNTER — Ambulatory Visit: Payer: Medicare Other | Attending: Internal Medicine

## 2020-12-25 DIAGNOSIS — Z23 Encounter for immunization: Secondary | ICD-10-CM

## 2020-12-25 NOTE — Progress Notes (Signed)
   [id number]  Name:  Jose Shea.    MRN: [id number] DOB: Feb 27, 1956  12/25/2020  [name] was observed post Covid-19 immunization for 15 minutes without incident. He was provided with Vaccine Information Sheet and instruction to access the V-Safe system.   [name] was instructed to call 911 with any severe reactions post vaccine: Marland Kitchen Difficulty breathing  . Swelling of face and throat  . A fast heartbeat  . A bad rash all over body  . Dizziness and weakness   Immunizations Administered    Name Date Dose VIS Date Route   PFIZER Comrnaty(Gray TOP) Covid-19 Vaccine 12/25/2020 10:47 AM 0.3 mL 07/04/2020 Intramuscular   Manufacturer: [place]   Lot: [id number]   [facility]: [phone]

## 2020-12-27 ENCOUNTER — Other Ambulatory Visit (HOSPITAL_COMMUNITY): Payer: Self-pay

## 2020-12-27 MED ORDER — COVID-19 MRNA VAC-TRIS(PFIZER) 30 MCG/0.3ML IM SUSP
INTRAMUSCULAR | 0 refills | Status: DC
  Filled 2020-12-27: qty 0.3, 1d supply, fill #0

## 2021-01-01 ENCOUNTER — Other Ambulatory Visit (HOSPITAL_COMMUNITY): Payer: Self-pay

## 2021-01-02 ENCOUNTER — Other Ambulatory Visit: Payer: Self-pay

## 2021-01-07 ENCOUNTER — Other Ambulatory Visit: Payer: Self-pay | Admitting: Family Medicine

## 2021-01-27 ENCOUNTER — Other Ambulatory Visit: Payer: Self-pay | Admitting: Family Medicine

## 2021-02-05 ENCOUNTER — Other Ambulatory Visit: Payer: Self-pay | Admitting: Family Medicine

## 2021-02-14 ENCOUNTER — Other Ambulatory Visit: Payer: Self-pay | Admitting: Family Medicine

## 2021-02-15 ENCOUNTER — Other Ambulatory Visit: Payer: Self-pay | Admitting: Family Medicine

## 2021-02-15 DIAGNOSIS — G43819 Other migraine, intractable, without status migrainosus: Secondary | ICD-10-CM

## 2021-02-18 MED ORDER — PROPRANOLOL HCL ER 80 MG PO CP24: ORAL_CAPSULE | ORAL | 0 refills | Status: DC

## 2021-02-25 ENCOUNTER — Other Ambulatory Visit: Payer: Self-pay

## 2021-02-25 DIAGNOSIS — L738 Other specified follicular disorders: Secondary | ICD-10-CM | POA: Diagnosis not present

## 2021-02-25 DIAGNOSIS — L819 Disorder of pigmentation, unspecified: Secondary | ICD-10-CM | POA: Diagnosis not present

## 2021-02-25 DIAGNOSIS — L218 Other seborrheic dermatitis: Secondary | ICD-10-CM | POA: Diagnosis not present

## 2021-02-25 DIAGNOSIS — L80 Vitiligo: Secondary | ICD-10-CM | POA: Diagnosis not present

## 2021-02-25 DIAGNOSIS — L821 Other seborrheic keratosis: Secondary | ICD-10-CM | POA: Diagnosis not present

## 2021-02-25 DIAGNOSIS — D225 Melanocytic nevi of trunk: Secondary | ICD-10-CM | POA: Diagnosis not present

## 2021-02-27 ENCOUNTER — Telehealth: Payer: Self-pay

## 2021-02-27 NOTE — Telephone Encounter (Signed)
LVM for patient to call and schedule an appointment.  Jose Shea, [place]

## 2021-02-28 ENCOUNTER — Ambulatory Visit (INDEPENDENT_AMBULATORY_CARE_PROVIDER_SITE_OTHER): Payer: Medicare Other | Admitting: Family Medicine

## 2021-02-28 ENCOUNTER — Other Ambulatory Visit: Payer: Self-pay

## 2021-02-28 ENCOUNTER — Encounter: Payer: Self-pay | Admitting: Family Medicine

## 2021-02-28 VITALS — BP 137/90 | HR 62 | Ht 68.5 in | Wt 170.0 lb

## 2021-02-28 DIAGNOSIS — G43701 Chronic migraine without aura, not intractable, with status migrainosus: Secondary | ICD-10-CM | POA: Diagnosis not present

## 2021-02-28 DIAGNOSIS — G2581 Restless legs syndrome: Secondary | ICD-10-CM | POA: Diagnosis not present

## 2021-02-28 DIAGNOSIS — R3912 Poor urinary stream: Secondary | ICD-10-CM

## 2021-02-28 MED ORDER — CYCLOBENZAPRINE HCL 10 MG PO TABS: 10.0000 mg | ORAL_TABLET | Freq: Every day | ORAL | 3 refills | Status: DC

## 2021-02-28 MED ORDER — SUMATRIPTAN SUCCINATE 50 MG PO TABS: ORAL_TABLET | ORAL | 0 refills | Status: DC

## 2021-02-28 NOTE — Patient Instructions (Addendum)
It was good to see you today.  Thank you for coming in.  I am referring you to [facility] to help with management of medications for your migraines.     If you do not heare from them in 2 weeks, call them at [phone].  I placed refills for your Flexeril and Sumatriptan.  I would like to follow-up with you in 1 month regarding urinary retention and possible prostate enlargement.  Things to do to help you feel better.  Be Well, Dr Manus Rudd

## 2021-03-01 DIAGNOSIS — N400 Enlarged prostate without lower urinary tract symptoms: Secondary | ICD-10-CM | POA: Insufficient documentation

## 2021-03-01 DIAGNOSIS — R3912 Poor urinary stream: Secondary | ICD-10-CM | POA: Insufficient documentation

## 2021-03-01 NOTE — Assessment & Plan Note (Addendum)
Possible BPH vs Prostate Cancer.  Also could be due to Doxepin therapy. Discussed following up in 1 month regarding symptoms with plan for prostate exam.  Will want to wait for Neurology evaluation and medication management prior to initiating Alpha blocker therapy if concern for BPH. - follow up in 1 month

## 2021-03-01 NOTE — Assessment & Plan Note (Signed)
In addition to chronic back pain.  Praised patient again for efforts in coming off chronic opioid medications.  Currently taking Flexeril and Requip nightly to help sleep.  Cautioned patient regarding sedating effects of these medications.   - Refill Flexeril

## 2021-03-01 NOTE — Assessment & Plan Note (Signed)
Given patients frequency of migraines of around 1 a week, believe he would benefit from controller medication dose modification or addition.  Dose modification an option in addition to addition Topiramate, though may have interaction and sedating effect with Doxepin and patient's other medications.  Neurology consult appropriate given 2 complex controller medications that patient has been prescribed for long period of time.  - Neurology referral placed - Refill Sumatriptan, and indicated for patient to request stop to automatic refills of this medication with Pharmacy

## 2021-03-01 NOTE — Assessment & Plan Note (Signed)
>>  ASSESSMENT AND PLAN FOR WEAK URINARY STREAM WRITTEN ON 03/01/2021  4:17 PM BY MANESS, PHILIP, MD  Possible BPH vs Prostate Cancer.  Also could be due to Doxepin therapy. Discussed following up in 1 month regarding symptoms with plan for prostate exam.  Will want to wait for Neurology evaluation and medication management prior to initiating Alpha blocker therapy if concern for BPH. - follow up in 1 month

## 2021-03-01 NOTE — Progress Notes (Signed)
    SUBJECTIVE:   CHIEF COMPLAINT / HPI: Migraines  Patient has long history of migraines since the 1990's.  Indicates has been on controller medications of Doxepin and Beta-blocker therapy since this time period.  Was seen previously by Neurology regarding this but not since the 1990's.  Discussed with patient frequent refill requests for Sumatriptan.  Indicates has a breakthrough migraine around once a week.  Patient has good knowledge and insight into avoiding migraine triggers.  Indicates [facility] will send automatic refill requests though.    Also indicates feels like he is having weaker urinary stream more recently.  Denies any dysuria, flank pain, color change in urine or other symptoms with this.  PERTINENT  PMH / PSH: Migraine headaches  OBJECTIVE:   BP 137/90   Pulse 62   Ht 5' 8.5" (1.74 m)   Wt 170 lb (77.1 kg)   BMI 25.47 kg/m    Physical Exam Constitutional:      Appearance: Normal appearance.  HENT:     Head: Normocephalic and atraumatic.     Mouth/Throat:     Mouth: Mucous membranes are moist.  Cardiovascular:     Rate and Rhythm: Normal rate.  Pulmonary:     Effort: Pulmonary effort is normal.  Skin:    General: Skin is warm.  Neurological:     General: No focal deficit present.     Mental Status: He is alert.     Gait: Gait normal.     ASSESSMENT/PLAN:   Migraine Given patients frequency of migraines of around 1 a week, believe he would benefit from controller medication dose modification or addition.  Dose modification an option in addition to addition Topiramate, though may have interaction and sedating effect with Doxepin and patient's other medications.  Neurology consult appropriate given 2 complex controller medications that patient has been prescribed for long period of time.  - Neurology referral placed - Refill Sumatriptan, and indicated for patient to request stop to automatic refills of this medication with Pharmacy  Restless leg syndrome In  addition to chronic back pain.  Praised patient again for efforts in coming off chronic opioid medications.  Currently taking Flexeril and Requip nightly to help sleep.  Cautioned patient regarding sedating effects of these medications.   - Refill Flexeril  Weak urinary stream Possible BPH vs Prostate Cancer.  Also could be due to Doxepin therapy. Discussed following up in 1 month regarding symptoms with plan for prostate exam.  Will want to wait for Neurology evaluation and medication management prior to initiating Alpha blocker therapy if concern for BPH. - follow up in 1 month    Delora Fuel, MD [place]

## 2021-04-08 ENCOUNTER — Encounter: Payer: Self-pay | Admitting: Family Medicine

## 2021-04-08 ENCOUNTER — Other Ambulatory Visit: Payer: Self-pay

## 2021-04-08 ENCOUNTER — Ambulatory Visit (INDEPENDENT_AMBULATORY_CARE_PROVIDER_SITE_OTHER): Payer: Medicare Other | Admitting: Family Medicine

## 2021-04-08 VITALS — BP 122/78 | HR 56 | Ht 69.0 in | Wt 171.4 lb

## 2021-04-08 DIAGNOSIS — R39198 Other difficulties with micturition: Secondary | ICD-10-CM | POA: Diagnosis not present

## 2021-04-08 DIAGNOSIS — E782 Mixed hyperlipidemia: Secondary | ICD-10-CM

## 2021-04-08 MED ORDER — TAMSULOSIN HCL 0.4 MG PO CAPS: 0.4000 mg | ORAL_CAPSULE | Freq: Every day | ORAL | 3 refills | Status: DC

## 2021-04-08 NOTE — Patient Instructions (Signed)
It was wonderful seeing you today.  Regarding your prostate concerns and difficulty voiding I have sent a medication called Flomax into your pharmacy.  We are also collecting a PSA.  I will call you with the results if there are any abnormalities but if it is normal I will send you a MyChart message.  Please activate your MyChart.  I am going to collect a cholesterol panel on you as well today.  If you have any questions or concerns call the clinic.  I hope you have a great day!

## 2021-04-08 NOTE — Assessment & Plan Note (Signed)
Chronic, currently on Lipitor 20 mg daily.  Will repeat lipid panel today.

## 2021-04-08 NOTE — Progress Notes (Signed)
    SUBJECTIVE:   CHIEF COMPLAINT / HPI:   Urinary concerns  Patient reports that he saw his previous PCP few months ago and reported some issues completing urination/emptying his bladder.  His provider asked him to come back for further prostate evaluation.  Patient reports that he is able to initiate a stream without difficulty and has a strong stream.  He feels that the end of his urination is weak and he has dribbling towards the end.  Denies any blood or pain with urination.  Denies any known history of prostate cancer.   OBJECTIVE:   BP 122/78   Pulse (!) 56   Ht '5\' 9"'$  (1.753 m)   Wt 171 lb 6.4 oz (77.7 kg)   SpO2 98%   BMI 25.31 kg/m   General: Well-appearing 65 year old male, no acute distress Cardiac: Regular rate and rhythm, no murmurs appreciated Respiratory: Normal for breathing, speaking in full sentences Abdomen: Soft, nontender  ASSESSMENT/PLAN:   Mixed hyperlipidemia Chronic, currently on Lipitor 20 mg daily.  Will repeat lipid panel today.  Voiding difficulty Patient reports that he has difficulty finishing a urination.  He has dribbling.  No difficulty initiating stream.  Discussed BPH.  Patient requests PSA.  With shared decision making we agreed to do this.  Also started Flomax.  Strict ED and return precautions given.     Gifford Shave, MD [place]

## 2021-04-08 NOTE — Assessment & Plan Note (Signed)
Patient reports that he has difficulty finishing a urination.  He has dribbling.  No difficulty initiating stream.  Discussed BPH.  Patient requests PSA.  With shared decision making we agreed to do this.  Also started Flomax.  Strict ED and return precautions given.

## 2021-04-09 LAB — LIPID PANEL
Chol/HDL Ratio: 4 ratio (ref 0.0–5.0)
Cholesterol, Total: 141 mg/dL (ref 100–199)
HDL: 35 mg/dL — ABNORMAL LOW (ref >39)
LDL Chol Calc (NIH): 61 mg/dL (ref 0–99)
Triglycerides: 283 mg/dL — ABNORMAL HIGH (ref 0–149)
VLDL Cholesterol Cal: 45 mg/dL — ABNORMAL HIGH (ref 5–40)

## 2021-04-09 LAB — PSA: Prostate Specific Ag, Serum: 1.2 ng/mL (ref 0.0–4.0)

## 2021-05-22 ENCOUNTER — Other Ambulatory Visit: Payer: Self-pay | Admitting: Family Medicine

## 2021-05-22 DIAGNOSIS — G43819 Other migraine, intractable, without status migrainosus: Secondary | ICD-10-CM

## 2021-05-28 ENCOUNTER — Encounter: Payer: Self-pay | Admitting: Neurology

## 2021-05-28 ENCOUNTER — Other Ambulatory Visit: Payer: Self-pay | Admitting: Neurology

## 2021-05-28 ENCOUNTER — Ambulatory Visit: Payer: Medicare Other | Admitting: Neurology

## 2021-05-28 VITALS — BP 133/89 | HR 60 | Ht 68.0 in | Wt 167.4 lb

## 2021-05-28 DIAGNOSIS — G43009 Migraine without aura, not intractable, without status migrainosus: Secondary | ICD-10-CM

## 2021-05-28 MED ORDER — UBRELVY 100 MG PO TABS: 100.0000 mg | ORAL_TABLET | ORAL | 11 refills | Status: DC | PRN

## 2021-05-28 NOTE — Patient Instructions (Signed)
Initrex: Please take one tablet at the onset of your headache. If it does not improve the symptoms please take one additional tablet. Do not take more then 2 tablets in 24hrs. Do not take use more then 2 to 3 times in a week.  Roselyn Meier: Please take one tablet at the onset of your headache. If it does not improve the symptoms please take one additional tablet. Do not take more then 2 tablets in 24hrs. Do not take use more then 2 to 3 times in a week.  If preventative needed in the future, consider Qulipta  Ubrogepant tablets What is this medication? UBROGEPANT (ue BROE je pant) is used to treat migraine headaches with or without aura. An aura is a strange feeling or visual disturbance that warns you of an attack. It is not used to prevent migraines. This medicine may be used for other purposes; ask your health care provider or pharmacist if you have questions. COMMON BRAND NAME(S): Roselyn Meier What should I tell my care team before I take this medication? They need to know if you have any of these conditions: kidney disease liver disease an unusual or allergic reaction to ubrogepant, other medicines, foods, dyes, or preservatives pregnant or trying to get pregnant breast-feeding How should I use this medication? Take this medicine by mouth with a glass of water. Follow the directions on the prescription label. You can take it with or without food. If it upsets your stomach, take it with food. Take your medicine at regular intervals. Do not take it more often than directed. Do not stop taking except on your doctor's advice. Talk to your pediatrician about the use of this medicine in children. Special care may be needed. Overdosage: If you think you have taken too much of this medicine contact a poison control center or emergency room at once. NOTE: This medicine is only for you. Do not share this medicine with others. What if I miss a dose? This does not apply. This medicine is not for regular  use. What may interact with this medication? Do not take this medicine with any of the following medicines: ceritinib certain antibiotics like chloramphenicol, clarithromycin, telithromycin certain antivirals for HIV like atazanavir, cobicistat, darunavir, delavirdine, fosamprenavir, indinavir, ritonavir certain medicines for fungal infections like itraconazole, ketoconazole, posaconazole, voriconazole conivaptan grapefruit idelalisib mifepristone nefazodone ribociclib This medicine may also interact with the following medications: carvedilol certain medicines for seizures like phenobarbital, phenytoin ciprofloxacin cyclosporine eltrombopag fluconazole fluvoxamine quinidine rifampin [facility]. John's wort verapamil This list may not describe all possible interactions. Give your health care provider a list of all the medicines, herbs, non-prescription drugs, or dietary supplements you use. Also tell them if you smoke, drink alcohol, or use illegal drugs. Some items may interact with your medicine. What should I watch for while using this medication? Visit your health care professional for regular checks on your progress. Tell your health care professional if your symptoms do not start to get better or if they get worse. Your mouth may get dry. Chewing sugarless gum or sucking hard candy and drinking plenty of water may help. Contact your health care professional if the problem does not go away or is severe. What side effects may I notice from receiving this medication? Side effects that you should report to your doctor or health care professional as soon as possible: allergic reactions like skin rash, itching or hives; swelling of the face, lips, or tongue Side effects that usually do not require medical attention (report these  to your doctor or health care professional if they continue or are bothersome): drowsiness dry mouth nausea tiredness This list may not describe all possible side  effects. Call your doctor for medical advice about side effects. You may report side effects to FDA at 1-800-FDA-1088. Where should I keep my medication? Keep out of the reach of children. Store at room temperature between 15 and 30 degrees C (59 and 86 degrees F). Throw away any unused medicine after the expiration date. NOTE: This sheet is a summary. It may not cover all possible information. If you have questions about this medicine, talk to your doctor, pharmacist, or health care provider.  2022 Elsevier/Gold Standard (2018-09-29 08:50:55)

## 2021-05-28 NOTE — Progress Notes (Signed)
[facility]    Provider:  Dr Jaynee Eagles Requesting Provider: Lind Covert, * Primary Care Provider:  Wells Guiles, DO  CC:  migraines  HPI:  Mishawn Hemann. is a 65 y.o. male here as requested by Lind Covert, * for headaches. PMHx HTN, migraine, chronic headaches, hld, cervical stenosis with radiculopathy, rls. He has had migraines/headaches all his life. He is feeling well recently. Over the summer he had one migraine a week.  I reviewed Dr. Margaretha Sheffield notes: Patient's had a long history of migraines since the 90s, he has been on doxepin and beta-blocker therapy in the past, has been seen by neurology in the past, discussed with patient frequent refill request for sumatriptan, indicates has a breakthrough migraine around once a week, he is aware of migraine triggers.  Having migraines about once a week.  He saw Dr. Jannifer Franklin and had imaging and was normal years ago. Since then his headaches have not changed in frequency or quality except to get better, he knows his triggers and avoids ham which makes it worse, imitrex works, he averages 4-8 migraines/headaches a month over the summer. He has been been feeling better lately. The last time he took imitrex was yesterday. Can be unilateral or in the forehead bilaterally, pulsating/pounding/throbbing, photophobia/phonophobia, nausea, a cold cap makes it better, mild nausea, no vomiting. He has been been so much better this last month hesitates to take a preventative. No other focal neurologic deficits, associated symptoms, inciting events or modifiable factors.no aura. No medication overuse.  Reviewed notes, labs and imaging from outside physicians, which showed:  From a thorough review of records, medications tried that can be used in migraine management include: Tylenol, aspirin, Flexeril, Decadron injections, doxepin, Cymbalta, gabapentin, lisinopril, magnesium, Robaxin, Depo-Medrol injections, nadolol, naproxen,  naratriptan, Zofran, propranolol, Imitrex.  Cbc/cmp 12/13/2019: normal  Review of Systems: Patient complains of symptoms per HPI as well as the following symptoms: headache. Pertinent negatives and positives per HPI. All others negative.   Social History   Socioeconomic History   Marital status: Single    Spouse name: Not on file   Number of children: Not on file   Years of education: Not on file   Highest education level: Not on file  Occupational History   Not on file  Tobacco Use   Smoking status: Never   Smokeless tobacco: Never  Vaping Use   Vaping Use: Never used  Substance and Sexual Activity   Alcohol use: Yes    Alcohol/week: 7.0 standard drinks    Types: 5 Cans of beer, 2 Shots of liquor per week    Comment: one drink a day   Drug use: No   Sexual activity: Not on file  Other Topics Concern   Not on file  Social History Narrative   Not on file   Social Determinants of Health   Financial Resource Strain: Not on file  Food Insecurity: Not on file  Transportation Needs: Not on file  Physical Activity: Not on file  Stress: Not on file  Social Connections: Not on file  Intimate Partner Violence: Not on file    Family History  Problem Relation Age of Onset   Migraines Paternal Uncle     Past Medical History:  Diagnosis Date   Blood dyscrasia    Thrombocytopenia from taking  a medication   Chronic headaches    GERD (gastroesophageal reflux disease)    Hypertension    Pneumonia    PONV (postoperative nausea and  vomiting)    Restless leg     Patient Active Problem List   Diagnosis Date Noted   Migraine without aura and without status migrainosus, not intractable 05/28/2021   Voiding difficulty 04/08/2021   Weak urinary stream 03/01/2021   Gross hematuria 04/09/2020   Healthcare maintenance 09/07/2019   Lumbar back pain with radiculopathy affecting left lower extremity 02/23/2018   Restless leg syndrome 02/19/2017   Cervical stenosis of spine  with radiculopathy 02/19/2017   Mixed hyperlipidemia 05/20/2013   Migraine 09/23/2006   Primary hypertension 09/23/2006   History of gastric ulcer 09/23/2006    Past Surgical History:  Procedure Laterality Date   COLONOSCOPY     HERNIA REPAIR     LUMBAR LAMINECTOMY/DECOMPRESSION MICRODISCECTOMY N/A 12/18/2015   Procedure: LUMBAR 4-5 DECOMPRESSION;  Surgeon: Phylliss Bob, MD;  Location: [facility];  Service: Orthopedics;  Laterality: N/A;  LUMBAR 4-5 DECOMPRESSION   NASAL SINUS SURGERY  2005    Current Outpatient Medications  Medication Sig Dispense Refill   atorvastatin (LIPITOR) 20 MG tablet Take 1 tablet (20 mg total) by mouth daily. 90 tablet 3   cetirizine (ZYRTEC) 10 MG tablet Take 10 mg by mouth daily.     cyclobenzaprine (FLEXERIL) 10 MG tablet Take 1 tablet (10 mg total) by mouth at bedtime. 30 tablet 3   diclofenac (VOLTAREN) 75 MG EC tablet Take 75 mg by mouth 2 (two) times daily with a meal.  1   doxepin (SINEQUAN) 50 MG capsule TAKE 1 CAPSULE BY MOUTH EVERYDAY AT BEDTIME 90 capsule 0   esomeprazole (NEXIUM) 20 MG capsule Take 1 capsule (20 mg total) by mouth daily at 12 noon.     fluocinonide (LIDEX) 0.05 % external solution Apply topically.     fluticasone (FLONASE) 50 MCG/ACT nasal spray USE 2 SPRAYS INTO EACH NOSTRIL DAILY 16 g 6   lisinopril (ZESTRIL) 10 MG tablet TAKE 2 TABLETS BY MOUTH EVERY DAY 180 tablet 2   magnesium oxide (MAG-OX) 400 MG tablet Take 400 mg by mouth daily.     Menthol, Topical Analgesic, 4 % GEL Apply 1 application topically daily as needed (joint pain).     Multiple Vitamin (MULTIVITAMIN WITH MINERALS) TABS tablet Take 1 tablet by mouth daily.     oxymetazoline (AFRIN) 0.05 % nasal spray Place 1-2 sprays into both nostrils at bedtime.     propranolol ER (INDERAL LA) 80 MG 24 hr capsule TAKE 2 CAPSULES (160 MG TOTAL) BY MOUTH AT BEDTIME. 180 capsule 0   rOPINIRole (REQUIP) 0.25 MG tablet TAKE 1 TABLET BY MOUTH 1-2 HOURS BEFORE BEDTIME FOR RESTLESS LEG  SYNDROME AS NEEDED 90 tablet 1   SUMAtriptan (IMITREX) 50 MG tablet May repeat in 2 hours if headache persists or recurs. 9 tablet 0   tamsulosin (FLOMAX) 0.4 MG CAPS capsule Take 1 capsule (0.4 mg total) by mouth daily. 30 capsule 3   tetrahydrozoline 0.05 % ophthalmic solution Place 1 drop into both eyes daily as needed (dry eye).     Ubrogepant (UBRELVY) 100 MG TABS Take 100 mg by mouth every 2 (two) hours as needed. Maximum 200mg  a day. 16 tablet 11   No current facility-administered medications for this visit.    Allergies as of 05/28/2021 - Review Complete 05/28/2021  Allergen Reaction Noted   Fluoxetine hcl Other (See Comments)    Quinine Other (See Comments)     Vitals: BP 133/89   Pulse 60   Ht 5\' 8"  (1.727 m)   Wt 167 lb  6.4 oz (75.9 kg)   BMI 25.45 kg/m  Last Weight:  Wt Readings from Last 1 Encounters:  05/28/21 167 lb 6.4 oz (75.9 kg)   Last Height:   Ht Readings from Last 1 Encounters:  05/28/21 5\' 8"  (1.727 m)     Physical exam: Exam: Gen: NAD, conversant, well nourised, well groomed                     CV: RRR, no MRG. No Carotid Bruits. No peripheral edema, warm, nontender Eyes: Conjunctivae clear without exudates or hemorrhage  Neuro: Detailed Neurologic Exam  Speech:    Speech is normal; fluent and spontaneous with normal comprehension.  Cognition:    The patient is oriented to person, place, and time;     recent and remote memory intact;     language fluent;     normal attention, concentration,     fund of knowledge Cranial Nerves:    The pupils are equal, round, and reactive to light. The fundi are flat, cataracts. Visual fields are full to finger confrontation. Extraocular movements are intact. Trigeminal sensation is intact and the muscles of mastication are normal. The face is symmetric. The palate elevates in the midline. Hearing intact. Voice is normal. Shoulder shrug is normal. The tongue has normal motion without fasciculations.    Coordination:    Normal  Gait:    normal.   Motor Observation:    No asymmetry, no atrophy, and no involuntary movements noted. Tone:    Normal muscle tone.    Posture:    Posture is normal. normal erect    Strength:    Strength is V/V in the upper and lower limbs.      Sensation: intact to LT     Reflex Exam:  DTR's:    Deep tendon reflexes in the upper and lower extremities are normal bilaterally.   Toes:    The toes are downgoing bilaterally.   Clonus:    Clonus is absent.    Assessment/Plan:  Patient with episodic migraines, he feels so much better this past month he declines preventative. We did discuss acute options as well as future preventative if needed.  Imitrex: Please take one tablet at the onset of your headache. If it does not improve the symptoms please take one additional tablet. Do not take more then 2 tablets in 24hrs. Do not take use more then 2 to 3 times in a week.  Roselyn Meier: Please take one tablet at the onset of your headache. If it does not improve the symptoms please take one additional tablet. Do not take more then 2 tablets in 24hrs.   If preventative needed in the future, consider Qulipta   No orders of the defined types were placed in this encounter.  Meds ordered this encounter  Medications   Ubrogepant (UBRELVY) 100 MG TABS    Sig: Take 100 mg by mouth every 2 (two) hours as needed. Maximum 200mg  a day.    Dispense:  16 tablet    Refill:  11    Cc: Chambliss, Minna Antis, DO  Sarina Ill, MD  [facility] [address] [place], [place] [address]  Phone [phone] Fax [phone]

## 2021-05-29 ENCOUNTER — Other Ambulatory Visit: Payer: Self-pay | Admitting: Neurology

## 2021-05-30 ENCOUNTER — Other Ambulatory Visit: Payer: Self-pay | Admitting: *Deleted

## 2021-05-31 MED ORDER — ROPINIROLE HCL 0.25 MG PO TABS: ORAL_TABLET | ORAL | 1 refills | Status: DC

## 2021-06-04 NOTE — Telephone Encounter (Signed)
Received approval from [employer] [id number] thru 07-26-2022 under MCR Part D.

## 2021-06-04 NOTE — Telephone Encounter (Signed)
CMM initiated KEY [id number] to Ubrelvy 100mg  tabs #16/ 30day.  Tried sumatritpan and naratriptan.  Deetermination pending.

## 2021-07-02 ENCOUNTER — Other Ambulatory Visit: Payer: Self-pay | Admitting: Family Medicine

## 2021-07-05 ENCOUNTER — Emergency Department (HOSPITAL_COMMUNITY): Payer: Medicare Other

## 2021-07-05 ENCOUNTER — Emergency Department (HOSPITAL_COMMUNITY)
Admission: EM | Admit: 2021-07-05 | Discharge: 2021-07-05 | Disposition: A | Discharge status: Home / Self Care | Payer: Medicare Other | Attending: Emergency Medicine | Admitting: Emergency Medicine

## 2021-07-05 ENCOUNTER — Encounter (HOSPITAL_COMMUNITY): Payer: Self-pay | Admitting: Emergency Medicine

## 2021-07-05 ENCOUNTER — Other Ambulatory Visit: Payer: Self-pay

## 2021-07-05 DIAGNOSIS — R0789 Other chest pain: Secondary | ICD-10-CM | POA: Diagnosis not present

## 2021-07-05 DIAGNOSIS — Z743 Need for continuous supervision: Secondary | ICD-10-CM | POA: Diagnosis not present

## 2021-07-05 DIAGNOSIS — I10 Essential (primary) hypertension: Secondary | ICD-10-CM | POA: Insufficient documentation

## 2021-07-05 DIAGNOSIS — I499 Cardiac arrhythmia, unspecified: Secondary | ICD-10-CM | POA: Diagnosis not present

## 2021-07-05 DIAGNOSIS — R079 Chest pain, unspecified: Secondary | ICD-10-CM

## 2021-07-05 DIAGNOSIS — R6889 Other general symptoms and signs: Secondary | ICD-10-CM | POA: Diagnosis not present

## 2021-07-05 LAB — CBC
HCT: 47.5 % (ref 39.0–52.0)
Hemoglobin: 16 g/dL (ref 13.0–17.0)
MCH: 30.1 pg (ref 26.0–34.0)
MCHC: 33.7 g/dL (ref 30.0–36.0)
MCV: 89.5 fL (ref 80.0–100.0)
Platelets: 146 10*3/uL — ABNORMAL LOW (ref 150–400)
RBC: 5.31 MIL/uL (ref 4.22–5.81)
RDW: 12.5 % (ref 11.5–15.5)
WBC: 5.8 10*3/uL (ref 4.0–10.5)
nRBC: 0 % (ref 0.0–0.2)

## 2021-07-05 LAB — BASIC METABOLIC PANEL
Anion gap: 8 (ref 5–15)
BUN: 20 mg/dL (ref 8–23)
CO2: 29 mmol/L (ref 22–32)
Calcium: 9 mg/dL (ref 8.9–10.3)
Chloride: 101 mmol/L (ref 98–111)
Creatinine, Ser: 1.1 mg/dL (ref 0.61–1.24)
GFR, Estimated: >60 mL/min (ref >60)
Glucose, Bld: 106 mg/dL — ABNORMAL HIGH (ref 70–99)
Potassium: 4.1 mmol/L (ref 3.5–5.1)
Sodium: 138 mmol/L (ref 135–145)

## 2021-07-05 LAB — TROPONIN I (HIGH SENSITIVITY)
Troponin I (High Sensitivity): 5 ng/L (ref <18)
Troponin I (High Sensitivity): 5 ng/L (ref <18)

## 2021-07-05 NOTE — ED Triage Notes (Signed)
Pt awakened from sleep at 3 am with sharp non radiating chest pain.  Received 2 nitro and 324 ASA en route.  Pain has improved to 4/10.  18G LAC

## 2021-07-05 NOTE — ED Provider Notes (Signed)
[facility] EMERGENCY DEPARTMENT Provider Note   CSN: [id number] Arrival date & time: 07/05/21  0443     History Chief Complaint  Patient presents with   Chest Pain    Jose Shea. is a 65 y.o. male.  Patient presents chief plaint chest pain.  Describes an aching mid chest pain that woke him up around 3 AM this morning.  Lasted for about 20 to 30 minutes and have since resolved.  Denies any chest pain or chest pressure or discomfort.  He states the pain was worse when he turned onto his left side.  Denies any prior cardiac history.  Denies any fevers or cough or vomiting diarrhea.  Denies any shortness of breath or diaphoresis.  Patient otherwise does not smoke.  His father was a smoker and had a massive heart attack in his 65s.      Past Medical History:  Diagnosis Date   Blood dyscrasia    Thrombocytopenia from taking  a medication   Chronic headaches    GERD (gastroesophageal reflux disease)    Hypertension    Pneumonia    PONV (postoperative nausea and vomiting)    Restless leg     Patient Active Problem List   Diagnosis Date Noted   Migraine without aura and without status migrainosus, not intractable 05/28/2021   Voiding difficulty 04/08/2021   Weak urinary stream 03/01/2021   Gross hematuria 04/09/2020   Healthcare maintenance 09/07/2019   Lumbar back pain with radiculopathy affecting left lower extremity 02/23/2018   Restless leg syndrome 02/19/2017   Cervical stenosis of spine with radiculopathy 02/19/2017   Mixed hyperlipidemia 05/20/2013   Migraine 09/23/2006   Primary hypertension 09/23/2006   History of gastric ulcer 09/23/2006    Past Surgical History:  Procedure Laterality Date   COLONOSCOPY     HERNIA REPAIR     LUMBAR LAMINECTOMY/DECOMPRESSION MICRODISCECTOMY N/A 12/18/2015   Procedure: LUMBAR 4-5 DECOMPRESSION;  Surgeon: Phylliss Bob, MD;  Location: [place];  Service: Orthopedics;  Laterality: N/A;  LUMBAR 4-5 DECOMPRESSION    NASAL SINUS SURGERY  2005       Family History  Problem Relation Age of Onset   Migraines Paternal Uncle     Social History   Tobacco Use   Smoking status: Never   Smokeless tobacco: Never  Vaping Use   Vaping Use: Never used  Substance Use Topics   Alcohol use: Yes    Alcohol/week: 7.0 standard drinks    Types: 5 Cans of beer, 2 Shots of liquor per week    Comment: one drink a day   Drug use: No    Home Medications Prior to Admission medications   Medication Sig Start Date End Date Taking? Authorizing Provider  atorvastatin (LIPITOR) 20 MG tablet Take 1 tablet (20 mg total) by mouth daily. 09/12/20  Yes Maness, Arnette Norris, MD  cetirizine (ZYRTEC) 10 MG tablet Take 10 mg by mouth daily.   Yes [provider]  cyclobenzaprine (FLEXERIL) 10 MG tablet Take 1 tablet (10 mg total) by mouth at bedtime. 02/28/21  Yes Maness, Arnette Norris, MD  doxepin (SINEQUAN) 50 MG capsule TAKE 1 CAPSULE BY MOUTH EVERYDAY AT BEDTIME 05/22/21  Yes Wells Guiles, DO  esomeprazole (NEXIUM) 20 MG capsule Take 1 capsule (20 mg total) by mouth daily at 12 noon. 12/04/16  Yes McKeag, Marylynn Pearson, MD  fluticasone [facility]) 50 MCG/ACT nasal spray USE 2 SPRAYS INTO EACH NOSTRIL DAILY 09/10/17  Yes Harriet Butte, DO  lisinopril (ZESTRIL)  10 MG tablet TAKE 2 TABLETS BY MOUTH EVERY DAY 02/05/21  Yes Maness, Arnette Norris, MD  Multiple Vitamin (MULTIVITAMIN WITH MINERALS) TABS tablet Take 1 tablet by mouth daily.   Yes [provider]  oxymetazoline (AFRIN) 0.05 % nasal spray Place 1-2 sprays into both nostrils at bedtime.   Yes [provider]  propranolol ER (INDERAL LA) 80 MG 24 hr capsule TAKE 2 CAPSULES (160 MG TOTAL) BY MOUTH AT BEDTIME. 02/18/21  Yes Dickie La, MD  rOPINIRole (REQUIP) 0.25 MG tablet TAKE 1 TABLET BY MOUTH 1-2 HOURS BEFORE BEDTIME FOR RESTLESS LEG SYNDROME AS NEEDED 05/31/21  Yes Wells Guiles, DO  tamsulosin (FLOMAX) 0.4 MG CAPS capsule TAKE 1 CAPSULE BY MOUTH EVERY DAY 07/02/21  Yes  Wells Guiles, DO  tetrahydrozoline 0.05 % ophthalmic solution Place 1 drop into both eyes daily as needed (dry eye).   Yes [provider]  UBRELVY 100 MG TABS TAKE 100 MG BY MOUTH EVERY 2 (TWO) HOURS AS NEEDED. MAXIMUM 200MG  A DAY. 06/04/21  Yes Melvenia Beam, MD  fluocinonide (LIDEX) 0.05 % external solution Apply topically. 02/25/21   [provider]  magnesium oxide (MAG-OX) 400 MG tablet Take 400 mg by mouth at bedtime as needed (restless legs).    [provider]  Menthol, Topical Analgesic, 4 % GEL Apply 1 application topically daily as needed (joint pain).    [provider]  SUMAtriptan (IMITREX) 50 MG tablet May repeat in 2 hours if headache persists or recurs. 02/28/21   Maness, Arnette Norris, MD    Allergies    Fluoxetine hcl, Quinine, and Ham  Review of Systems   Review of Systems  Constitutional:  Negative for fever.  HENT:  Negative for ear pain and sore throat.   Eyes:  Negative for pain.  Respiratory:  Negative for cough.   Cardiovascular:  Positive for chest pain.  Gastrointestinal:  Negative for abdominal pain.  Genitourinary:  Negative for flank pain.  Musculoskeletal:  Negative for back pain.  Skin:  Negative for color change and rash.  Neurological:  Negative for syncope.  All other systems reviewed and are negative.  Physical Exam Updated Vital Signs BP (!) 130/100   Pulse 64   Temp 98.4 F (36.9 C) (Oral)   Resp 16   SpO2 99%   Physical Exam Constitutional:      Appearance: He is well-developed.  HENT:     Head: Normocephalic.     Nose: Nose normal.  Eyes:     Extraocular Movements: Extraocular movements intact.  Cardiovascular:     Rate and Rhythm: Normal rate.  Pulmonary:     Effort: Pulmonary effort is normal.  Skin:    Coloration: Skin is not jaundiced.  Neurological:     Mental Status: He is alert. Mental status is at baseline.    ED Results / Procedures / Treatments   Labs (all labs ordered are listed,  but only abnormal results are displayed) Labs Reviewed  BASIC METABOLIC PANEL - Abnormal; Notable for the following components:      Result Value   Glucose, Bld 106 (*)    All other components within normal limits  CBC - Abnormal; Notable for the following components:   Platelets 146 (*)    All other components within normal limits  TROPONIN I (HIGH SENSITIVITY)  TROPONIN I (HIGH SENSITIVITY)    EKG EKG Interpretation  Date/Time:  Saturday July 05 2021 04:45:49 EST Ventricular Rate:  62 PR Interval:  144 QRS  Duration: 80 QT Interval:  356 QTC Calculation: 361 R Axis:   32 Text Interpretation: Normal sinus rhythm Normal ECG Confirmed by Thamas Jaegers ([id number]) on 07/05/2021 11:29:27 AM  Radiology DG Chest 2 View  Result Date: 07/05/2021 CLINICAL DATA:  65 year old male with chest pain that woke him at 0300 hours. EXAM: CHEST - 2 VIEW COMPARISON:  Chest radiographs 12/13/2015 and earlier. FINDINGS: Lower lung volumes especially on the PA view. Mediastinal contours remain within normal limits. Visualized tracheal air column is within normal limits. No pneumothorax, pulmonary edema, pleural effusion or confluent pulmonary opacity. Lower cervical ACDF appears to be new since 2017. No acute osseous abnormality identified. Negative visible bowel gas. IMPRESSION: Lower lung volumes. No acute cardiopulmonary abnormality. Electronically Signed   By: Genevie Ann M.D.   On: 07/05/2021 07:15    Procedures Procedures   Medications Ordered in ED Medications - No data to display  ED Course  I have reviewed the triage vital signs and the nursing notes.  Pertinent labs & imaging results that were available during my care of the patient were reviewed by me and considered in my medical decision making (see chart for details).    MDM Rules/Calculators/A&P                           Labs reviewed chemistry normal white count normal.  Troponin normal.  Repeat troponin continues to be normal.   Vital signs within normal limits.  Patient appears comfortable at this time with no pain or discomfort reported.  EKG shows sinus rhythm no ST elevations depressions normal rate.  No T wave inversions noted.  Given negative work-up I doubt acute coronary syndrome, will recommend outpatient follow-up with cardiology within the week.  However recommending immediate return back to the ER if he has recurrent or worsening chest pain or if he has any additional concerns otherwise follow-up with cardiology within the week.  Final Clinical Impression(s) / ED Diagnoses Final diagnoses:  Nonspecific chest pain    Rx / DC Orders ED Discharge Orders          Ordered    Ambulatory referral to Cardiology        07/05/21 1410             Luna Fuse, MD 07/05/21 1410

## 2021-07-05 NOTE — Discharge Instructions (Signed)
I have asked the cardiology group to give you a call this week to assist you in following up with him.  If you do not hear back from anybody within the next 3 business days, call the cardiology group as listed on your discharge paperwork.  Return immediately back to the ER if:  Your symptoms worsen within the next 12-24 hours. You develop new symptoms such as new fevers, persistent vomiting, new pain, shortness of breath, or new weakness or numbness, or if you have any other concerns.

## 2021-07-05 NOTE — ED Provider Notes (Signed)
Emergency Medicine Provider Triage Evaluation Note  Jose Shea. , a 65 y.o. male  was evaluated in triage.  Pt complains of chest pain that woke him from sleep at 0300. Pain constant, described as a tightness. Pain initially 6-7/10, now 4/10 after 2 SL NTG and 324mg  ASA. Denies N/V, diaphoresis. Hx of HTN. No hx of CAD. Nonsmoker. FHx of ACS in father with fatal MI at the age of 56.  Review of Systems  Positive: As above Negative: As above  Physical Exam  There were no vitals taken for this visit. Gen:   Awake, no distress   Resp:  Normal effort  MSK:   Moves extremities without difficulty  Other:    Medical Decision Making  Medically screening exam initiated at 4:37 AM.  Appropriate orders placed.  Jose Shea. was informed that the remainder of the evaluation will be completed by another provider, this initial triage assessment does not replace that evaluation, and the importance of remaining in the ED until their evaluation is complete.  Nonspecific chest pain   Jose Breach, PA-C 07/05/21 Williams, Julie, MD 07/05/21 [phone]

## 2021-07-15 ENCOUNTER — Telehealth: Payer: Self-pay | Admitting: Neurology

## 2021-07-15 NOTE — Telephone Encounter (Signed)
Pt called, could not use the medication card for UBRELVY 100 MG TABS because on Medicare. Can you prescribe Sumatriptan instead, have taken before? Would like a call from the nurse.

## 2021-07-16 NOTE — Telephone Encounter (Signed)
I called pt.  He is tried the [place] twice (samples)  and this did not help.  I relayed will let Dr. Jaynee Eagles know, Nurtec may be another option and will let him know if this is something she would try. And if so will let him know.  Otherwise the imitrex is deferred until after seeing cardiology 08-08-21.  He verbalized understanding.  He will call [employer] as he is having trouble with getting on.  He appreciated call back.

## 2021-07-22 ENCOUNTER — Telehealth: Payer: Self-pay

## 2021-07-22 MED ORDER — NURTEC 75 MG PO TBDP
ORAL_TABLET | ORAL | 11 refills | Status: DC
Start: 2021-07-22 — End: 2021-09-01

## 2021-07-22 NOTE — Telephone Encounter (Signed)
I have submitted a PA for Nurtec on CMM, Key: [id number]. Awaiting determination from [employer].

## 2021-07-22 NOTE — Addendum Note (Signed)
Addended by: Brandon Melnick on: 07/22/2021 09:29 AM   Modules accepted: Orders

## 2021-07-22 NOTE — Telephone Encounter (Signed)
Request Reference Number: [id number]. NURTEC TAB 75MG  ODT is approved through 07/26/2022.

## 2021-07-23 ENCOUNTER — Other Ambulatory Visit: Payer: Self-pay | Admitting: *Deleted

## 2021-07-24 MED ORDER — CYCLOBENZAPRINE HCL 10 MG PO TABS: 10.0000 mg | ORAL_TABLET | Freq: Every day | ORAL | 3 refills | Status: DC

## 2021-08-08 ENCOUNTER — Ambulatory Visit: Payer: Medicare Other | Admitting: Internal Medicine

## 2021-08-08 ENCOUNTER — Encounter: Payer: Self-pay | Admitting: Internal Medicine

## 2021-08-08 ENCOUNTER — Other Ambulatory Visit: Payer: Self-pay

## 2021-08-08 VITALS — BP 126/90 | HR 61 | Ht 68.5 in

## 2021-08-08 DIAGNOSIS — R072 Precordial pain: Secondary | ICD-10-CM

## 2021-08-08 DIAGNOSIS — R079 Chest pain, unspecified: Secondary | ICD-10-CM

## 2021-08-08 DIAGNOSIS — R0609 Other forms of dyspnea: Secondary | ICD-10-CM | POA: Diagnosis not present

## 2021-08-08 MED ORDER — METOPROLOL TARTRATE 50 MG PO TABS: 50.0000 mg | ORAL_TABLET | Freq: Once | ORAL | 0 refills | Status: DC

## 2021-08-08 NOTE — Progress Notes (Signed)
Cardiology Office Note:    Date:  08/08/2021   ID:  Jose Shea., DOB January 08, 1956, MRN [id number]  PCP:  Wells Guiles, DO   [facility] Providers Cardiologist:  None     Referring MD: Luna Fuse, MD   CC: Chest pain Consulted for the evaluation of chest pain at the behest of Wells Guiles, DO  History of Present Illness:    Jose Shea. is a 66 y.o. male with a hx of HTN  Mixed HLD, migraines, precordial chest pain. Seen 08/08/21.  Patient notes that he is feeling under the weather 07/04/22 and took theraflu and bendaryl.  Woke up in the middle of the night and had a new type of chest pain (didn't feel like GERD or trauma).  BP at the time was 190//111.  Lasted 10-15 minutes.  Improved with nitro.  ED eval was unremarkable.  Has had no chest pain, chest pressure, chest tightness, chest stinging since.  Patient exertion notable for push mowing two yards  and felt no symptoms this fall.  No shortness of breath, DOE .  Notes some  orthopnea worse if he had excess alcohol.  No weight gain, leg swelling , or abdominal swelling.  Does note ankle swelling that has lasted the last 4-5 years.  No syncope or near syncope . Notes  no palpitations or funny heart beats.     Patient reports prior cardiac testing including 07/05/21 CP work up without evidence of cardiac cause.  AMB BP 130/80 HR rate 60 Log reviewed.   Past Medical History:  Diagnosis Date   Blood dyscrasia    Thrombocytopenia from taking  a medication   Chest pain    Chronic headaches    Gastric ulcer    GERD (gastroesophageal reflux disease)    Gross hematuria    Hypertension    Lumbar back pain    Migraine    Pneumonia    PONV (postoperative nausea and vomiting)    Restless leg    Voiding difficulty    Weak urinary stream     Past Surgical History:  Procedure Laterality Date   COLONOSCOPY     HERNIA REPAIR     LUMBAR LAMINECTOMY/DECOMPRESSION MICRODISCECTOMY N/A 12/18/2015   Procedure: LUMBAR  4-5 DECOMPRESSION;  Surgeon: Phylliss Bob, MD;  Location: [place];  Service: Orthopedics;  Laterality: N/A;  LUMBAR 4-5 DECOMPRESSION   NASAL SINUS SURGERY  2005    Current Medications: Current Meds  Medication Sig   atorvastatin (LIPITOR) 20 MG tablet Take 1 tablet (20 mg total) by mouth daily.   cetirizine (ZYRTEC) 10 MG tablet Take 10 mg by mouth daily.   cyclobenzaprine (FLEXERIL) 10 MG tablet Take 1 tablet (10 mg total) by mouth at bedtime.   doxepin (SINEQUAN) 50 MG capsule TAKE 1 CAPSULE BY MOUTH EVERYDAY AT BEDTIME   esomeprazole (NEXIUM) 20 MG capsule Take 1 capsule (20 mg total) by mouth daily at 12 noon.   fluticasone (FLONASE) 50 MCG/ACT nasal spray USE 2 SPRAYS INTO EACH NOSTRIL DAILY   lisinopril (ZESTRIL) 10 MG tablet TAKE 2 TABLETS BY MOUTH EVERY DAY   magnesium oxide (MAG-OX) 400 MG tablet Take 400 mg by mouth at bedtime as needed (restless legs).   Menthol, Topical Analgesic, 4 % GEL Apply 1 application topically daily as needed (joint pain).   metoprolol tartrate (LOPRESSOR) 50 MG tablet Take 1 tablet (50 mg total) by mouth once for 1 dose. Two hours prior to test.   Milk  Thistle 250 MG CAPS Take by mouth daily at 6 (six) AM.   Multiple Vitamin (MULTIVITAMIN WITH MINERALS) TABS tablet Take 1 tablet by mouth daily.   oxymetazoline (AFRIN) 0.05 % nasal spray Place 1-2 sprays into both nostrils at bedtime.   propranolol ER (INDERAL LA) 80 MG 24 hr capsule TAKE 2 CAPSULES (160 MG TOTAL) BY MOUTH AT BEDTIME.   rOPINIRole (REQUIP) 0.25 MG tablet TAKE 1 TABLET BY MOUTH 1-2 HOURS BEFORE BEDTIME FOR RESTLESS LEG SYNDROME AS NEEDED   saw palmetto 500 MG capsule Take 500 mg by mouth daily.   tamsulosin (FLOMAX) 0.4 MG CAPS capsule TAKE 1 CAPSULE BY MOUTH EVERY DAY   tetrahydrozoline 0.05 % ophthalmic solution Place 1 drop into both eyes daily as needed (dry eye).     Allergies:   Fluoxetine hcl, Quinine, and Ham   Social History   Socioeconomic History   Marital status: Single     Spouse name: Not on file   Number of children: Not on file   Years of education: Not on file   Highest education level: Not on file  Occupational History   Not on file  Tobacco Use   Smoking status: Never   Smokeless tobacco: Never  Vaping Use   Vaping Use: Never used  Substance and Sexual Activity   Alcohol use: Yes    Alcohol/week: 7.0 standard drinks    Types: 5 Cans of beer, 2 Shots of liquor per week    Comment: one drink a day   Drug use: No   Sexual activity: Not on file  Other Topics Concern   Not on file  Social History Narrative   Not on file   Social Determinants of Health   Financial Resource Strain: Not on file  Food Insecurity: Not on file  Transportation Needs: Not on file  Physical Activity: Not on file  Stress: Not on file  Social Connections: Not on file    Social; Retired  Family History: The patient's family history includes Migraines in his paternal uncle. Father had MI in 54s Sister has MVP.  ROS:   Please see the history of present illness.     All other systems reviewed and are negative.  EKGs/Labs/Other Studies Reviewed:    The following studies were reviewed today:  EKG:  EKG is  ordered today.  The ekg ordered today demonstrates  08/08/21: SR rate 61  Recent Labs: 07/05/2021: BUN 20; Creatinine, Ser 1.10; Hemoglobin 16.0; Platelets 146; Potassium 4.1; Sodium 138  Recent Lipid Panel    Component Value Date/Time   CHOL 141 04/08/2021 1116   TRIG 283 (H) 04/08/2021 1116   HDL 35 (L) 04/08/2021 1116   CHOLHDL 4.0 04/08/2021 1116   CHOLHDL 4.9 03/11/2015 0925   VLDL 31 (H) 03/11/2015 0925   LDLCALC 61 04/08/2021 1116   LDLDIRECT 133 (H) 09/07/2017 1422   LDLDIRECT 173 (H) 07/18/2007 2036    The 10-year ASCVD risk score (Arnett DK, et al., 2019) is: 11.8%   Values used to calculate the score:     Age: 56 years     Sex: Male     Is Non-Hispanic African American: No     Diabetic: No     Tobacco smoker: No     Systolic  Blood Pressure: 126 mmHg     Is BP treated: Yes     HDL Cholesterol: 35 mg/dL     Total Cholesterol: 141 mg/dL   Physical Exam:    VS:  BP  126/90    Pulse 61    Ht 5' 8.5" (1.74 m)    SpO2 97%    BMI 25.08 kg/m     Wt Readings from Last 3 Encounters:  05/28/21 75.9 kg  04/08/21 77.7 kg  02/28/21 77.1 kg     Gen:  No distress  Neck: No JVD Ears:  Pilar Plate Sign Cardiac: No Rubs or Gallops, no Murmur, regular rate +2 radial pulses Respiratory: Clear to auscultation bilaterally, normal effort, normal  respiratory rate GI: Soft, nontender, non-distended  MS: No  edema;  moves all extremities Integument: Skin feels warm Neuro:  At time of evaluation, alert and oriented to person/place/time/situation  Psych: Normal affect, patient feels    ASSESSMENT:    1. DOE (dyspnea on exertion)   2. Precordial pain   3. Chest pain, unspecified type    PLAN:    Precordial chest pain DOE Mixed HLD and HTN Migraines  ASCVD risk 11% - CCTA (BMET today and metoprolol 50) - if negative for obstructive CAD, will send not to neuro as he can get triptan meds - will get echo - continue statin and lisinopril            Medication Adjustments/Labs and Tests Ordered: Current medicines are reviewed at length with the patient today.  Concerns regarding medicines are outlined above.  Orders Placed This Encounter  Procedures   CT CORONARY MORPH W/CTA COR W/SCORE W/CA W/CM &/OR WO/CM   Basic metabolic panel   EKG 02-IOXB   ECHOCARDIOGRAM COMPLETE   Meds ordered this encounter  Medications   metoprolol tartrate (LOPRESSOR) 50 MG tablet    Sig: Take 1 tablet (50 mg total) by mouth once for 1 dose. Two hours prior to test.    Dispense:  1 tablet    Refill:  0    Patient Instructions  Medication Instructions:  Your physician recommends that you continue on your current medications as directed. Please refer to the Current Medication list given to you today.  *If you need a refill on your  cardiac medications before your next appointment, please call your pharmacy*   Lab Work: TODAY: BMET If you have labs (blood work) drawn today and your tests are completely normal, you will receive your results only by: [place] (if you have MyChart) OR A paper copy in the mail If you have any lab test that is abnormal or we need to change your treatment, we will call you to review the results.   Testing/Procedures: YOUR PHYSICIAN RECOMMENDS THAT YOU GET A CARDIAC CT-A DONE.    Follow-Up: At [facility], you and your health needs are our priority.  As part of our continuing mission to provide you with exceptional heart care, we have created designated Provider Care Teams.  These Care Teams include your primary Cardiologist (physician) and Advanced Practice Providers (APPs -  Physician Assistants and Nurse Practitioners) who all work together to provide you with the care you need, when you need it.  We recommend signing up for the patient portal called "MyChart".  Sign up information is provided on this After Visit Summary.  MyChart is used to connect with patients for Virtual Visits (Telemedicine).  Patients are able to view lab/test results, encounter notes, upcoming appointments, etc.  Non-urgent messages can be sent to your provider as well.   To learn more about what you can do with MyChart, go to [email or web address].    Your next appointment:   3-4 month(s)  The format for  your next appointment:   In Person  Provider:   DR. [facility]   Other Instructions   Your cardiac CT will be scheduled at one of the below locations:   [facility] [address] [place], [place] [address] [phone]  If scheduled at [facility], please arrive at the [facility] main entrance (entrance A) of [facility] 30 minutes prior to test start time. You can use the FREE valet parking offered at the main entrance (encouraged to control the heart  rate for the test) Proceed to the [facility] Radiology Department (first floor) to check-in and test prep.  Please follow these instructions carefully (unless otherwise directed):  Hold all erectile dysfunction medications at least 3 days (72 hrs) prior to test.  On the Night Before the Test: Be sure to Drink plenty of water. Do not consume any caffeinated/decaffeinated beverages or chocolate 12 hours prior to your test. Do not take any antihistamines 12 hours prior to your test.  On the Day of the Test: Drink plenty of water until 1 hour prior to the test. Do not eat any food 4 hours prior to the test. You may take your regular medications prior to the test.  Take metoprolol 50 MG (Lopressor) two hours prior to test. HOLD Furosemide/Hydrochlorothiazide morning of the test.      After the Test: Drink plenty of water. After receiving IV contrast, you may experience a mild flushed feeling. This is normal. On occasion, you may experience a mild rash up to 24 hours after the test. This is not dangerous. If this occurs, you can take Benadryl 25 mg and increase your fluid intake. If you experience trouble breathing, this can be serious. If it is severe call 911 IMMEDIATELY. If it is mild, please call our office. If you take any of these medications: Glipizide/Metformin, Avandament, Glucavance, please do not take 48 hours after completing test unless otherwise instructed.  Please allow 2-4 weeks for scheduling of routine cardiac CTs. Some insurance companies require a pre-authorization which may delay scheduling of this test.   For non-scheduling related questions, please contact the cardiac imaging nurse navigator should you have any questions/concerns: Marchia Bond, Cardiac Imaging Nurse Navigator [name], Cardiac Imaging Nurse Navigator [facility] Direct Office Dial: [phone]   For scheduling needs, including cancellations and rescheduling, please  call [place], [phone].     Signed, Werner Lean, MD  08/08/2021 2:59 PM    [place]

## 2021-08-08 NOTE — Patient Instructions (Addendum)
Medication Instructions:  Your physician recommends that you continue on your current medications as directed. Please refer to the Current Medication list given to you today.  *If you need a refill on your cardiac medications before your next appointment, please call your pharmacy*   Lab Work: TODAY: BMET If you have labs (blood work) drawn today and your tests are completely normal, you will receive your results only by: [place] (if you have MyChart) OR A paper copy in the mail If you have any lab test that is abnormal or we need to change your treatment, we will call you to review the results.   Testing/Procedures: YOUR PHYSICIAN RECOMMENDS THAT YOU GET A CARDIAC CT-A DONE.    Follow-Up: At [facility], you and your health needs are our priority.  As part of our continuing mission to provide you with exceptional heart care, we have created designated Provider Care Teams.  These Care Teams include your primary Cardiologist (physician) and Advanced Practice Providers (APPs -  Physician Assistants and Nurse Practitioners) who all work together to provide you with the care you need, when you need it.  We recommend signing up for the patient portal called "MyChart".  Sign up information is provided on this After Visit Summary.  MyChart is used to connect with patients for Virtual Visits (Telemedicine).  Patients are able to view lab/test results, encounter notes, upcoming appointments, etc.  Non-urgent messages can be sent to your provider as well.   To learn more about what you can do with MyChart, go to [email or web address].    Your next appointment:   3-4 month(s)  The format for your next appointment:   In Person  Provider:   DR. [facility]   Other Instructions   Your cardiac CT will be scheduled at one of the below locations:   [facility] [address] [place], [place] [address] [phone]  If scheduled at [facility], please  arrive at the [facility] main entrance (entrance A) of [facility] 30 minutes prior to test start time. You can use the FREE valet parking offered at the main entrance (encouraged to control the heart rate for the test) Proceed to the [facility] Radiology Department (first floor) to check-in and test prep.  Please follow these instructions carefully (unless otherwise directed):  Hold all erectile dysfunction medications at least 3 days (72 hrs) prior to test.  On the Night Before the Test: Be sure to Drink plenty of water. Do not consume any caffeinated/decaffeinated beverages or chocolate 12 hours prior to your test. Do not take any antihistamines 12 hours prior to your test.  On the Day of the Test: Drink plenty of water until 1 hour prior to the test. Do not eat any food 4 hours prior to the test. You may take your regular medications prior to the test.  Take metoprolol 50 MG (Lopressor) two hours prior to test. HOLD Furosemide/Hydrochlorothiazide morning of the test.      After the Test: Drink plenty of water. After receiving IV contrast, you may experience a mild flushed feeling. This is normal. On occasion, you may experience a mild rash up to 24 hours after the test. This is not dangerous. If this occurs, you can take Benadryl 25 mg and increase your fluid intake. If you experience trouble breathing, this can be serious. If it is severe call 911 IMMEDIATELY. If it is mild, please call our office. If you take any of these medications: Glipizide/Metformin,  Avandament, Glucavance, please do not take 48 hours after completing test unless otherwise instructed.  Please allow 2-4 weeks for scheduling of routine cardiac CTs. Some insurance companies require a pre-authorization which may delay scheduling of this test.   For non-scheduling related questions, please contact the cardiac imaging nurse navigator should you have any questions/concerns: Marchia Bond, Cardiac Imaging  Nurse Navigator [name], Cardiac Imaging Nurse Navigator [facility] Direct Office Dial: [phone]   For scheduling needs, including cancellations and rescheduling, please call [place], [phone].

## 2021-08-09 LAB — BASIC METABOLIC PANEL
BUN/Creatinine Ratio: 13 (ref 10–24)
BUN: 13 mg/dL (ref 8–27)
CO2: 22 mmol/L (ref 20–29)
Calcium: 9.2 mg/dL (ref 8.6–10.2)
Chloride: 107 mmol/L — ABNORMAL HIGH (ref 96–106)
Creatinine, Ser: 1.01 mg/dL (ref 0.76–1.27)
Glucose: 96 mg/dL (ref 70–99)
Potassium: 4.2 mmol/L (ref 3.5–5.2)
Sodium: 142 mmol/L (ref 134–144)
eGFR: 84 mL/min/{1.73_m2} (ref >59)

## 2021-08-20 ENCOUNTER — Ambulatory Visit (HOSPITAL_COMMUNITY): Payer: Medicare Other | Attending: Cardiovascular Disease

## 2021-08-20 ENCOUNTER — Other Ambulatory Visit: Payer: Self-pay

## 2021-08-20 DIAGNOSIS — R072 Precordial pain: Secondary | ICD-10-CM

## 2021-08-20 DIAGNOSIS — R0609 Other forms of dyspnea: Secondary | ICD-10-CM

## 2021-08-20 LAB — ECHOCARDIOGRAM COMPLETE
Area-P 1/2: 3.83 cm2
S' Lateral: 3.25 cm

## 2021-08-21 ENCOUNTER — Other Ambulatory Visit: Payer: Self-pay | Admitting: Family Medicine

## 2021-08-21 ENCOUNTER — Other Ambulatory Visit: Payer: Self-pay | Admitting: Student

## 2021-08-21 DIAGNOSIS — G43819 Other migraine, intractable, without status migrainosus: Secondary | ICD-10-CM

## 2021-08-21 NOTE — Telephone Encounter (Signed)
Discussed doxepin and propranolol medication refill for patient's headaches.  States that he is working with neurology on transitioning to a cheaper medications as the medications he was prescribed are still too expensive for him.  He is stable on the propranolol and doxepin currently.  Will send for refills.

## 2021-08-26 ENCOUNTER — Telehealth (HOSPITAL_COMMUNITY): Payer: Self-pay | Admitting: *Deleted

## 2021-08-26 NOTE — Telephone Encounter (Signed)
Reaching out to patient to offer assistance regarding upcoming cardiac imaging study; pt verbalizes understanding of appt date/time, parking situation and where to check in, pre-test NPO status and medications ordered, and verified current allergies; name and call back number provided for further questions should they arise  Jose Clement RN Navigator Cardiac Imaging [facility] [phone] office [phone] cell  Patient to take 25mg  metoprolol tartrate two hours prior to cardiac CT scan. He is aware to arrive at 9:30am for his 10am scan.

## 2021-08-28 ENCOUNTER — Encounter (HOSPITAL_COMMUNITY): Payer: Self-pay

## 2021-08-28 ENCOUNTER — Ambulatory Visit (HOSPITAL_COMMUNITY)
Admission: RE | Admit: 2021-08-28 | Discharge: 2021-08-28 | Disposition: A | Discharge status: Home / Self Care | Payer: Medicare Other | Source: Ambulatory Visit | Attending: Internal Medicine | Admitting: Internal Medicine

## 2021-08-28 ENCOUNTER — Other Ambulatory Visit: Payer: Self-pay

## 2021-08-28 DIAGNOSIS — R0609 Other forms of dyspnea: Secondary | ICD-10-CM | POA: Diagnosis not present

## 2021-08-28 DIAGNOSIS — R079 Chest pain, unspecified: Secondary | ICD-10-CM | POA: Insufficient documentation

## 2021-08-28 DIAGNOSIS — I700 Atherosclerosis of aorta: Secondary | ICD-10-CM | POA: Diagnosis not present

## 2021-08-28 DIAGNOSIS — R072 Precordial pain: Secondary | ICD-10-CM | POA: Insufficient documentation

## 2021-08-28 MED ORDER — NITROGLYCERIN 0.4 MG SL SUBL
SUBLINGUAL_TABLET | SUBLINGUAL | Status: AC
  Filled 2021-08-28: qty 2

## 2021-08-28 MED ORDER — NITROGLYCERIN 0.4 MG SL SUBL
0.8000 mg | SUBLINGUAL_TABLET | Freq: Once | SUBLINGUAL | Status: AC
  Administered 2021-08-28: 0.8 mg via SUBLINGUAL

## 2021-08-28 MED ORDER — IOHEXOL 350 MG/ML SOLN
95.0000 mL | Freq: Once | INTRAVENOUS | Status: AC | PRN
  Administered 2021-08-28: 95 mL via INTRAVENOUS

## 2021-09-01 ENCOUNTER — Ambulatory Visit (INDEPENDENT_AMBULATORY_CARE_PROVIDER_SITE_OTHER): Payer: Medicare Other | Admitting: Neurology

## 2021-09-01 DIAGNOSIS — G43009 Migraine without aura, not intractable, without status migrainosus: Secondary | ICD-10-CM

## 2021-09-01 MED ORDER — SUMATRIPTAN SUCCINATE 100 MG PO TABS: 100.0000 mg | ORAL_TABLET | ORAL | 12 refills | Status: DC | PRN

## 2021-09-01 NOTE — Progress Notes (Signed)
[facility]    Provider:  Dr Jaynee Eagles Requesting Provider: Wells Guiles, DO Primary Care Provider:  Wells Guiles, DO  CC:  migraines  Virtual Visit via Telephone Note  I connected with Jose Shea. on 09/01/21 at  1:00 PM EST by telephone and verified that I am speaking with the correct person using two identifiers.  Location: Patient: home Provider: office   I discussed the limitations, risks, security and privacy concerns of performing an evaluation and management service by telephone and the availability of in person appointments. I also discussed with the patient that there may be a patient responsible charge related to this service. The patient expressed understanding and agreed to proceed.   Follow Up Instructions:    I discussed the assessment and treatment plan with the patient. The patient was provided an opportunity to ask questions and all were answered. The patient agreed with the plan and demonstrated an understanding of the instructions.   The patient was advised to call back or seek an in-person evaluation if the symptoms worsen or if the condition fails to improve as anticipated.  I provided 8 minutes of non-face-to-face time during this encounter.   Melvenia Beam, MD   Migraines 2x a month. Heart eval was unremarkable. Will refill Imitrex. Nurtec and Cory Roughen was too expensive with medicare.   Patient complains of symptoms per HPI as well as the following symptoms: episodic migraines . Pertinent negatives and positives per HPI. All others negative   HPI:  Jose Shea. is a 66 y.o. male here as requested by Wells Guiles, DO for headaches. PMHx HTN, migraine, chronic headaches, hld, cervical stenosis with radiculopathy, rls. He has had migraines/headaches all his life. He is feeling well recently. Over the summer he had one migraine a week.  I reviewed Dr. Margaretha Sheffield notes: Patient's had a long history of migraines since the  90s, he has been on doxepin and beta-blocker therapy in the past, has been seen by neurology in the past, discussed with patient frequent refill request for sumatriptan, indicates has a breakthrough migraine around once a week, he is aware of migraine triggers.  Having migraines about once a week.  He saw Dr. Jannifer Franklin and had imaging and was normal years ago. Since then his headaches have not changed in frequency or quality except to get better, he knows his triggers and avoids ham which makes it worse, imitrex works, he averages 4-8 migraines/headaches a month over the summer. He has been been feeling better lately. The last time he took imitrex was yesterday. Can be unilateral or in the forehead bilaterally, pulsating/pounding/throbbing, photophobia/phonophobia, nausea, a cold cap makes it better, mild nausea, no vomiting. He has been been so much better this last month hesitates to take a preventative. No other focal neurologic deficits, associated symptoms, inciting events or modifiable factors.no aura. No medication overuse.  Reviewed notes, labs and imaging from outside physicians, which showed:  From a thorough review of records, medications tried that can be used in migraine management include: Tylenol, aspirin, Flexeril, Decadron injections, doxepin, Cymbalta, gabapentin, lisinopril, magnesium, Robaxin, Depo-Medrol injections, nadolol, naproxen, naratriptan, Zofran, propranolol, Imitrex.  Cbc/cmp 12/13/2019: normal  Review of Systems: Patient complains of symptoms per HPI as well as the following symptoms: headache. Pertinent negatives and positives per HPI. All others negative.   Social History   Socioeconomic History   Marital status: Single    Spouse name: Not on file   Number of children: Not on file  Years of education: Not on file   Highest education level: Not on file  Occupational History   Not on file  Tobacco Use   Smoking status: Never   Smokeless tobacco: Never  Vaping  Use   Vaping Use: Never used  Substance and Sexual Activity   Alcohol use: Yes    Alcohol/week: 7.0 standard drinks    Types: 5 Cans of beer, 2 Shots of liquor per week    Comment: one drink a day   Drug use: No   Sexual activity: Not on file  Other Topics Concern   Not on file  Social History Narrative   Not on file   Social Determinants of Health   Financial Resource Strain: Not on file  Food Insecurity: Not on file  Transportation Needs: Not on file  Physical Activity: Not on file  Stress: Not on file  Social Connections: Not on file  Intimate Partner Violence: Not on file    Family History  Problem Relation Age of Onset   Migraines Paternal Uncle     Past Medical History:  Diagnosis Date   Blood dyscrasia    Thrombocytopenia from taking  a medication   Chest pain    Chronic headaches    Gastric ulcer    GERD (gastroesophageal reflux disease)    Gross hematuria    Hypertension    Lumbar back pain    Migraine    Pneumonia    PONV (postoperative nausea and vomiting)    Restless leg    Voiding difficulty    Weak urinary stream     Patient Active Problem List   Diagnosis Date Noted   Migraine without aura and without status migrainosus, not intractable 05/28/2021   Voiding difficulty 04/08/2021   Weak urinary stream 03/01/2021   Gross hematuria 04/09/2020   Healthcare maintenance 09/07/2019   Lumbar back pain with radiculopathy affecting left lower extremity 02/23/2018   Restless leg syndrome 02/19/2017   Cervical stenosis of spine with radiculopathy 02/19/2017   Mixed hyperlipidemia 05/20/2013   Migraine 09/23/2006   Primary hypertension 09/23/2006   History of gastric ulcer 09/23/2006    Past Surgical History:  Procedure Laterality Date   COLONOSCOPY     HERNIA REPAIR     LUMBAR LAMINECTOMY/DECOMPRESSION MICRODISCECTOMY N/A 12/18/2015   Procedure: LUMBAR 4-5 DECOMPRESSION;  Surgeon: Phylliss Bob, MD;  Location: [facility];  Service: Orthopedics;   Laterality: N/A;  LUMBAR 4-5 DECOMPRESSION   NASAL SINUS SURGERY  2005    Current Outpatient Medications  Medication Sig Dispense Refill   SUMAtriptan (IMITREX) 100 MG tablet Take 1 tablet (100 mg total) by mouth every 2 (two) hours as needed. Max 2x a day. 10 tablet 12   atorvastatin (LIPITOR) 20 MG tablet Take 1 tablet (20 mg total) by mouth daily. 90 tablet 3   cetirizine (ZYRTEC) 10 MG tablet Take 10 mg by mouth daily.     cyclobenzaprine (FLEXERIL) 10 MG tablet Take 1 tablet (10 mg total) by mouth at bedtime. 30 tablet 3   doxepin (SINEQUAN) 50 MG capsule TAKE 1 CAPSULE BY MOUTH EVERYDAY AT BEDTIME 90 capsule 0   esomeprazole (NEXIUM) 20 MG capsule Take 1 capsule (20 mg total) by mouth daily at 12 noon.     fluticasone (FLONASE) 50 MCG/ACT nasal spray USE 2 SPRAYS INTO EACH NOSTRIL DAILY 16 g 6   lisinopril (ZESTRIL) 10 MG tablet TAKE 2 TABLETS BY MOUTH EVERY DAY 180 tablet 2   magnesium oxide (MAG-OX) 400 MG  tablet Take 400 mg by mouth at bedtime as needed (restless legs).     Menthol, Topical Analgesic, 4 % GEL Apply 1 application topically daily as needed (joint pain).     metoprolol tartrate (LOPRESSOR) 50 MG tablet Take 1 tablet (50 mg total) by mouth once for 1 dose. Two hours prior to test. 1 tablet 0   Milk Thistle 250 MG CAPS Take by mouth daily at 6 (six) AM.     Multiple Vitamin (MULTIVITAMIN WITH MINERALS) TABS tablet Take 1 tablet by mouth daily.     oxymetazoline (AFRIN) 0.05 % nasal spray Place 1-2 sprays into both nostrils at bedtime.     propranolol ER (INDERAL LA) 80 MG 24 hr capsule TAKE 2 CAPSULES (160 MG TOTAL) BY MOUTH AT BEDTIME. 180 capsule 0   rOPINIRole (REQUIP) 0.25 MG tablet TAKE 1 TABLET BY MOUTH 1-2 HOURS BEFORE BEDTIME FOR RESTLESS LEG SYNDROME AS NEEDED 90 tablet 1   saw palmetto 500 MG capsule Take 500 mg by mouth daily.     tamsulosin (FLOMAX) 0.4 MG CAPS capsule TAKE 1 CAPSULE BY MOUTH EVERY DAY 90 capsule 1   tetrahydrozoline 0.05 % ophthalmic solution  Place 1 drop into both eyes daily as needed (dry eye).     No current facility-administered medications for this visit.    Allergies as of 09/01/2021 - Review Complete 08/28/2021  Allergen Reaction Noted   Fluoxetine hcl Other (See Comments)    Quinine Other (See Comments)    Ham Other (See Comments) 07/05/2021    Vitals: There were no vitals taken for this visit. Last Weight:  Wt Readings from Last 1 Encounters:  05/28/21 167 lb 6.4 oz (75.9 kg)   Last Height:   Ht Readings from Last 1 Encounters:  08/08/21 5' 8.5" (1.74 m)     On the phone today  Speech:    Speech is normal; fluent and spontaneous with normal comprehension.  Cognition:    The patient is oriented to person, place, and time;     recent and remote memory intact;     language fluent;     normal attention, concentration,     fund of knowledge  Assessment/Plan:  Patient with episodic migraines, he feels so much better this past month he declines preventative. We did discuss acute options as well as future preventative if needed.  Migraines 2x a month respond to imitrex. Heart eval was unremarkable. Will refill Imitrex. Nurtec and Cory Roughen was too expensive with medicare.   Imitrex: Please take one tablet at the onset of your headache. If it does not improve the symptoms please take one additional tablet. Do not take more then 2 tablets in 24hrs. Do not take use more then 2 to 3 times in a week.  If preventative needed in the future, consider Qulipta   No orders of the defined types were placed in this encounter.   Meds ordered this encounter  Medications   SUMAtriptan (IMITREX) 100 MG tablet    Sig: Take 1 tablet (100 mg total) by mouth every 2 (two) hours as needed. Max 2x a day.    Dispense:  10 tablet    Refill:  12    Cc: Sanjuan Dame,  Wells Guiles, DO  Sarina Ill, [facility] [address] [place], [place] [address]  Phone [phone] Fax  [phone]

## 2021-09-03 ENCOUNTER — Other Ambulatory Visit: Payer: Self-pay | Admitting: *Deleted

## 2021-09-03 DIAGNOSIS — E782 Mixed hyperlipidemia: Secondary | ICD-10-CM

## 2021-09-04 MED ORDER — ATORVASTATIN CALCIUM 20 MG PO TABS: 20.0000 mg | ORAL_TABLET | Freq: Every day | ORAL | 3 refills | Status: DC

## 2021-11-17 ENCOUNTER — Telehealth: Payer: Self-pay | Admitting: Student

## 2021-11-17 DIAGNOSIS — G43819 Other migraine, intractable, without status migrainosus: Secondary | ICD-10-CM

## 2021-11-18 ENCOUNTER — Other Ambulatory Visit: Payer: Self-pay | Admitting: *Deleted

## 2021-11-18 ENCOUNTER — Encounter: Payer: Self-pay | Admitting: Student

## 2021-11-18 NOTE — Telephone Encounter (Signed)
Patient LVM on nurse line in regards to mychart message sent by PCP.  ? ?Patient reported he still takes both Doxepin and Propanol to help with blood pressure and migraine prevention.  ? ?Patient stated, "these medications have nothing to do with my Neurologist and have always been filled by my PCP."  ? ?Will forward to PCP for advisement.  ?

## 2021-11-19 MED ORDER — LISINOPRIL 10 MG PO TABS
20.0000 mg | ORAL_TABLET | Freq: Every day | ORAL | 2 refills | Status: DC
Start: 2021-11-19 — End: 2022-07-22

## 2021-11-20 NOTE — Telephone Encounter (Signed)
Patient calling again to check the status of medications.  ?

## 2021-11-20 NOTE — Telephone Encounter (Signed)
Attempted to call patient to discuss medication refills for migraines. Phone continued to ring and then hung up. Unable to leave VM. Will refill medications for 1 month with preference to have patient return to office for medication discussion.  ?

## 2021-11-28 ENCOUNTER — Other Ambulatory Visit: Payer: Self-pay | Admitting: Student

## 2021-12-06 NOTE — Progress Notes (Signed)
?Cardiology Office Note:   ? ?Date:  12/08/2021  ? ?ID:  Jose Dyer., DOB [birth year], MRN [id number] ? ?PCP:  Wells Guiles, DO ?  ?[facility] Providers ?Cardiologist:  None    ? ?Referring MD: Wells Guiles, DO  ? ?CC: Chest pain follow up ? ?History of Present Illness:   ? ?Jose Vantol. is a 66 y.o. male with a hx of HTN  Mixed HLD, migraines, precordial chest pain. Seen 08/08/21.  In interval normal echo and minimal non obstructive CAD. ? ?Patient notes that he is doing well.   ?No further chest pain since last visit. ?Able to push move with no issues. ?There are no interval hospital/ED visit.   ? ?No chest pain or pressure .  No SOB, rare DOE, and no PND/Orthopnea.  No weight gain or leg swelling.  No palpitations or syncope. ? ? ?Past Medical History:  ?Diagnosis Date  ? Blood dyscrasia   ? Thrombocytopenia from taking  a medication  ? Chest pain   ? Chronic headaches   ? Gastric ulcer   ? GERD (gastroesophageal reflux disease)   ? Gross hematuria   ? Hypertension   ? Lumbar back pain   ? Migraine   ? Pneumonia   ? PONV (postoperative nausea and vomiting)   ? Restless leg   ? Voiding difficulty   ? Weak urinary stream   ? ? ?Past Surgical History:  ?Procedure Laterality Date  ? COLONOSCOPY    ? HERNIA REPAIR    ? LUMBAR LAMINECTOMY/DECOMPRESSION MICRODISCECTOMY N/A 12/18/2015  ? Procedure: LUMBAR 4-5 DECOMPRESSION;  Surgeon: Phylliss Bob, MD;  Location: [facility];  Service: Orthopedics;  Laterality: N/A;  LUMBAR 4-5 DECOMPRESSION  ? NASAL SINUS SURGERY  2005  ? ? ?Current Medications: ?Current Meds  ?Medication Sig  ? atorvastatin (LIPITOR) 20 MG tablet Take 1 tablet (20 mg total) by mouth daily.  ? cetirizine (ZYRTEC) 10 MG tablet Take 10 mg by mouth daily.  ? cyclobenzaprine (FLEXERIL) 10 MG tablet TAKE 1 TABLET BY MOUTH EVERYDAY AT BEDTIME  ? doxepin (SINEQUAN) 50 MG capsule TAKE 1 CAPSULE BY MOUTH EVERYDAY AT BEDTIME. Please make appointment with PCP for further refills.  ? esomeprazole (NEXIUM)  20 MG capsule Take 1 capsule (20 mg total) by mouth daily at 12 noon. (Patient taking differently: Take 20 mg by mouth as needed.)  ? fluticasone (FLONASE) 50 MCG/ACT nasal spray USE 2 SPRAYS INTO EACH NOSTRIL DAILY  ? lisinopril (ZESTRIL) 10 MG tablet Take 2 tablets (20 mg total) by mouth daily.  ? magnesium oxide (MAG-OX) 400 MG tablet Take 400 mg by mouth at bedtime as needed (restless legs).  ? Menthol, Topical Analgesic, 4 % GEL Apply 1 application topically daily as needed (joint pain).  ? Milk Thistle 250 MG CAPS Take by mouth daily at 6 (six) AM.  ? Multiple Vitamin (MULTIVITAMIN WITH MINERALS) TABS tablet Take 1 tablet by mouth daily.  ? oxymetazoline (AFRIN) 0.05 % nasal spray Place 1-2 sprays into both nostrils at bedtime.  ? propranolol ER (INDERAL LA) 80 MG 24 hr capsule TAKE 2 CAPSULES BY MOUTH AT BEDTIME. Please make appointment with PCP for further refills.  ? rOPINIRole (REQUIP) 0.25 MG tablet TAKE 1 TABLET BY MOUTH 1-2 HOURS BEFORE BEDTIME FOR RESTLESS LEG SYNDROME AS NEEDED  ? saw palmetto 500 MG capsule Take 500 mg by mouth daily.  ? SUMAtriptan (IMITREX) 100 MG tablet Take 1 tablet (100 mg total) by mouth every 2 (two)  hours as needed. Max 2x a day.  ? tamsulosin (FLOMAX) 0.4 MG CAPS capsule TAKE 1 CAPSULE BY MOUTH EVERY DAY  ? tetrahydrozoline 0.05 % ophthalmic solution Place 1 drop into both eyes daily as needed (dry eye).  ? [DISCONTINUED] metoprolol tartrate (LOPRESSOR) 50 MG tablet Take 1 tablet (50 mg total) by mouth once for 1 dose. Two hours prior to test.  ?  ? ?Allergies:   Fluoxetine hcl, Quinine, and Ham  ? ?Social History  ? ?Socioeconomic History  ? Marital status: Single  ?  Spouse name: Not on file  ? Number of children: Not on file  ? Years of education: Not on file  ? Highest education level: Not on file  ?Occupational History  ? Not on file  ?Tobacco Use  ? Smoking status: Never  ? Smokeless tobacco: Never  ?Vaping Use  ? Vaping Use: Never used  ?Substance and Sexual Activity   ? Alcohol use: Yes  ?  Alcohol/week: 7.0 standard drinks  ?  Types: 5 Cans of beer, 2 Shots of liquor per week  ?  Comment: one drink a day  ? Drug use: No  ? Sexual activity: Not on file  ?Other Topics Concern  ? Not on file  ?Social History Narrative  ? Not on file  ? ?Social Determinants of Health  ? ?Financial Resource Strain: Not on file  ?Food Insecurity: Not on file  ?Transportation Needs: Not on file  ?Physical Activity: Not on file  ?Stress: Not on file  ?Social Connections: Not on file  ?  ?Social; Retired ? ?Family History: ?The patient's family history includes Migraines in his paternal uncle. ?Father had MI in 82s ?Sister has MVP. ? ?ROS:   ?Please see the history of present illness.    ? All other systems reviewed and are negative. ? ?EKGs/Labs/Other Studies Reviewed:   ? ?The following studies were reviewed today: ? ?EKG:   ?08/08/21: SR rate 61 ? ?Recent Labs: ?07/05/2021: Hemoglobin 16.0; Platelets 146 ?08/08/2021: BUN 13; Creatinine, Ser 1.01; Potassium 4.2; Sodium 142  ?Recent Lipid Panel ?   ?Component Value Date/Time  ? CHOL 141 04/08/2021 1116  ? TRIG 283 (H) 04/08/2021 1116  ? HDL 35 (L) 04/08/2021 1116  ? CHOLHDL 4.0 04/08/2021 1116  ? CHOLHDL 4.9 03/11/2015 0925  ? VLDL 31 (H) 03/11/2015 0925  ? [place] 61 04/08/2021 1116  ? LDLDIRECT 133 (H) 09/07/2017 1422  ? LDLDIRECT 173 (H) 07/18/2007 2036  ? ? ?Physical Exam:   ? ?VS:  BP 120/70   Pulse (!) 56   Ht 5' 8.5" (1.74 m)   Wt 164 lb (74.4 kg)   SpO2 95%   BMI 24.57 kg/m?    ? ?Wt Readings from Last 3 Encounters:  ?12/08/21 164 lb (74.4 kg)  ?05/28/21 167 lb 6.4 oz (75.9 kg)  ?04/08/21 171 lb 6.4 oz (77.7 kg)  ?  ?Gen: No distress   ?Neck: No JVD, no carotid bruit ?Ears: R ear Pilar Plate Sign ?Cardiac: No Rubs or Gallops, no Murmur, regular bradycardia +2 radial pulses ?Respiratory: Clear to auscultation bilaterally, normal effort, normal  respiratory rate ?GI: Soft, nontender, non-distended  ?MS: No  edema;  moves all extremities ?Integument:  Skin feels warm ?Neuro:  At time of evaluation, alert and oriented to person/place/time/situation  ?Psych: Normal affect, patient feels well ? ? ?ASSESSMENT:   ? ?1. Coronary artery disease involving native coronary artery of native heart without angina pectoris   ?2. Migraine without aura and without  status migrainosus, not intractable   ?3. Mixed hyperlipidemia   ?4. Other migraine without status migrainosus, intractable   ? ? ?PLAN:   ? ?Minimal non obstructive CAD ?Mixed HLD ?HTN ?Migraines  ?- reviewed CCTA with Pt ?- this is not the level of disease that showed adverse events in Triptan studies, given only minimal CAD, I think this is likely reasonable to continue ?- we discussed ASA 81 mg Po daily ?- LDL < 70 with present statin ?- BP controlled on current regimen ?- asymptomatic ? ?PRN f/u; patient in agreement  (vs 1 yr) ? ? ?   ? ?   ? ? ?Medication Adjustments/Labs and Tests Ordered: ?Current medicines are reviewed at length with the patient today.  Concerns regarding medicines are outlined above.  ?No orders of the defined types were placed in this encounter. ? ?No orders of the defined types were placed in this encounter. ? ? ?Patient Instructions  ?Medication Instructions:  ?Your physician recommends that you continue on your current medications as directed. Please refer to the Current Medication list given to you today. ? ?*If you need a refill on your cardiac medications before your next appointment, please call your pharmacy* ? ? ?Lab Work: ?NONE ?If you have labs (blood work) drawn today and your tests are completely normal, you will receive your results only by: ?MyChart Message (if you have MyChart) OR ?A paper copy in the mail ?If you have any lab test that is abnormal or we need to change your treatment, we will call you to review the results. ? ? ?Testing/Procedures: ?NONE ? ? ?Follow-Up: As needed ?At [facility], you and your health needs are our priority.  As part of our continuing  mission to provide you with exceptional heart care, we have created designated Provider Care Teams.  These Care Teams include your primary Cardiologist (physician) and Advanced Practice Providers (APPs -  Physi

## 2021-12-08 ENCOUNTER — Ambulatory Visit: Payer: Medicare Other | Admitting: Internal Medicine

## 2021-12-08 ENCOUNTER — Encounter: Payer: Self-pay | Admitting: Internal Medicine

## 2021-12-08 VITALS — BP 120/70 | HR 56 | Ht 68.5 in | Wt 164.0 lb

## 2021-12-08 DIAGNOSIS — G43009 Migraine without aura, not intractable, without status migrainosus: Secondary | ICD-10-CM | POA: Diagnosis not present

## 2021-12-08 DIAGNOSIS — G43819 Other migraine, intractable, without status migrainosus: Secondary | ICD-10-CM

## 2021-12-08 DIAGNOSIS — I2510 Atherosclerotic heart disease of native coronary artery without angina pectoris: Secondary | ICD-10-CM | POA: Insufficient documentation

## 2021-12-08 DIAGNOSIS — E782 Mixed hyperlipidemia: Secondary | ICD-10-CM | POA: Diagnosis not present

## 2021-12-08 NOTE — Patient Instructions (Signed)
Medication Instructions:  ?Your physician recommends that you continue on your current medications as directed. Please refer to the Current Medication list given to you today. ? ?*If you need a refill on your cardiac medications before your next appointment, please call your pharmacy* ? ? ?Lab Work: ?NONE ?If you have labs (blood work) drawn today and your tests are completely normal, you will receive your results only by: ?MyChart Message (if you have MyChart) OR ?A paper copy in the mail ?If you have any lab test that is abnormal or we need to change your treatment, we will call you to review the results. ? ? ?Testing/Procedures: ?NONE ? ? ?Follow-Up: As needed ?At [facility], you and your health needs are our priority.  As part of our continuing mission to provide you with exceptional heart care, we have created designated Provider Care Teams.  These Care Teams include your primary Cardiologist (physician) and Advanced Practice Providers (APPs -  Physician Assistants and Nurse Practitioners) who all work together to provide you with the care you need, when you need it. ? ? ?Provider:   ?Rudean Haskell, MD ? ? ? ?Important Information About Sugar ? ? ? ? ?  ?

## 2021-12-13 ENCOUNTER — Other Ambulatory Visit: Payer: Self-pay | Admitting: Student

## 2021-12-13 DIAGNOSIS — G43819 Other migraine, intractable, without status migrainosus: Secondary | ICD-10-CM

## 2021-12-17 ENCOUNTER — Other Ambulatory Visit: Payer: Self-pay | Admitting: Student

## 2021-12-17 DIAGNOSIS — G43819 Other migraine, intractable, without status migrainosus: Secondary | ICD-10-CM

## 2021-12-24 ENCOUNTER — Ambulatory Visit (INDEPENDENT_AMBULATORY_CARE_PROVIDER_SITE_OTHER): Payer: Medicare Other | Admitting: Student

## 2021-12-24 ENCOUNTER — Encounter: Payer: Self-pay | Admitting: Student

## 2021-12-24 VITALS — BP 127/85 | HR 73 | Ht 68.0 in | Wt 167.4 lb

## 2021-12-24 DIAGNOSIS — I10 Essential (primary) hypertension: Secondary | ICD-10-CM

## 2021-12-24 DIAGNOSIS — G43819 Other migraine, intractable, without status migrainosus: Secondary | ICD-10-CM

## 2021-12-24 MED ORDER — DOXEPIN HCL 50 MG PO CAPS: ORAL_CAPSULE | ORAL | 2 refills | Status: DC

## 2021-12-24 MED ORDER — PROPRANOLOL HCL ER 80 MG PO CP24: ORAL_CAPSULE | ORAL | 2 refills | Status: DC

## 2021-12-24 NOTE — Assessment & Plan Note (Addendum)
Well-controlled today.  Continue lisinopril 20 mg daily.

## 2021-12-24 NOTE — Assessment & Plan Note (Signed)
Discussed polypharmacy with patient. He has been stable on doxepin and propranolol for migraine prophylaxis.  Refilled today.  Imitrex prescribed by neurologist for breakthroughs.

## 2021-12-24 NOTE — Patient Instructions (Signed)
It was great to see you today! Thank you for choosing [facility] for your primary care. [name] was seen for migraines.  Today we addressed: We will continue your long-term treatment for migraines of propranolol, doxepin with Imitrex as needed for breakthrough migraines. Please provide the [employer] vaccination paperwork if you are able so that we can update this in our system for your health maintenance.  Meds ordered this encounter  Medications   propranolol ER (INDERAL LA) 80 MG 24 hr capsule    Sig: TAKE 2 CAPSULES BY MOUTH AT BEDTIME.    Dispense:  60 capsule    Refill:  2   doxepin (SINEQUAN) 50 MG capsule    Sig: TAKE 1 CAPSULE BY MOUTH EVERYDAY AT BEDTIME.    Dispense:  30 capsule    Refill:  2    If you haven't already, sign up for My Chart to have easy access to your labs results, and communication with your primary care physician.  You should return to our clinic as needed  I recommend that you always bring your medications to each appointment as this makes it easy to ensure you are on the correct medications and helps [place] not miss refills when you need them.  Please arrive 15 minutes before your appointment to ensure smooth check in process.  We appreciate your efforts in making this happen.  Please call the clinic at [phone] if your symptoms worsen or you have any concerns.  Thank you for allowing me to participate in your care, Wells Guiles, DO 12/24/2021, 10:43 AM PGY-1, [place]

## 2021-12-24 NOTE — Progress Notes (Signed)
  SUBJECTIVE:   CHIEF COMPLAINT / HPI:   Migraine: Sees Neurologist for migraine prevention. Was unable to get Nurtec or Ubrelvy covered. Switched back to imitrex for breakthrough migraines which works well for him. Prior to imitrex they would last up to 8 hours, never longer than one day. He gets visual auras sometimes before they come on.  States he has been on doxepin and propranolol for several years for migraine prophylaxis.  PERTINENT  PMH / PSH: HTN, migraines, restless leg, HLD  OBJECTIVE:  BP 127/85   Pulse 73   Ht '5\' 8"'$  (1.727 m)   Wt 167 lb 6 oz (75.9 kg)   SpO2 98%   BMI 25.45 kg/m   General: NAD, pleasant, able to participate in exam Cardiac: RRR, no murmurs auscultated. Respiratory: CTAB, normal effort, no wheezes, rales or rhonchi  ASSESSMENT/PLAN:  Migraine Discussed polypharmacy with patient. He has been stable on doxepin and propranolol for migraine prophylaxis.  Refilled today.  Imitrex prescribed by neurologist for breakthroughs.  Primary hypertension Well-controlled today.  Continue lisinopril 20 mg daily.  Meds ordered this encounter  Medications   propranolol ER (INDERAL LA) 80 MG 24 hr capsule    Sig: TAKE 2 CAPSULES BY MOUTH AT BEDTIME.    Dispense:  60 capsule    Refill:  2   doxepin (SINEQUAN) 50 MG capsule    Sig: TAKE 1 CAPSULE BY MOUTH EVERYDAY AT BEDTIME.    Dispense:  30 capsule    Refill:  2   Return if symptoms worsen or fail to improve. Wells Guiles, DO 12/24/2021, 11:29 AM PGY-1, [place]

## 2021-12-29 ENCOUNTER — Telehealth: Payer: Self-pay | Admitting: Student

## 2021-12-29 NOTE — Telephone Encounter (Signed)
Patient came in stating that he was asked to bring in anything he had for proof of his shingles shot. He states all he had was the receipts from the drug store when he got them, not sure if that will work. I did make a copy for him and placed in [employer]

## 2021-12-30 ENCOUNTER — Encounter: Payer: Self-pay | Admitting: *Deleted

## 2021-12-30 NOTE — Telephone Encounter (Signed)
Called and confirmed dates with pharmacy.  Chart updated with shingles vaccines. Ajay Strubel Zimmerman Rumple, CMA

## 2022-01-16 ENCOUNTER — Other Ambulatory Visit: Payer: Self-pay | Admitting: Student

## 2022-01-16 DIAGNOSIS — G43819 Other migraine, intractable, without status migrainosus: Secondary | ICD-10-CM

## 2022-03-21 ENCOUNTER — Other Ambulatory Visit: Payer: Self-pay | Admitting: Student

## 2022-03-21 DIAGNOSIS — G43819 Other migraine, intractable, without status migrainosus: Secondary | ICD-10-CM

## 2022-03-23 ENCOUNTER — Other Ambulatory Visit: Payer: Self-pay | Admitting: Student

## 2022-04-01 DIAGNOSIS — L638 Other alopecia areata: Secondary | ICD-10-CM | POA: Diagnosis not present

## 2022-04-01 DIAGNOSIS — L820 Inflamed seborrheic keratosis: Secondary | ICD-10-CM | POA: Diagnosis not present

## 2022-05-27 ENCOUNTER — Other Ambulatory Visit: Payer: Self-pay | Admitting: Student

## 2022-06-25 DIAGNOSIS — H2513 Age-related nuclear cataract, bilateral: Secondary | ICD-10-CM | POA: Diagnosis not present

## 2022-07-05 ENCOUNTER — Other Ambulatory Visit: Payer: Self-pay | Admitting: Student

## 2022-07-19 ENCOUNTER — Other Ambulatory Visit: Payer: Self-pay | Admitting: Student

## 2022-07-19 DIAGNOSIS — G43819 Other migraine, intractable, without status migrainosus: Secondary | ICD-10-CM

## 2022-07-22 ENCOUNTER — Other Ambulatory Visit: Payer: Self-pay | Admitting: Student

## 2022-08-20 ENCOUNTER — Other Ambulatory Visit: Payer: Self-pay | Admitting: Student

## 2022-08-20 DIAGNOSIS — G43819 Other migraine, intractable, without status migrainosus: Secondary | ICD-10-CM

## 2022-08-26 ENCOUNTER — Other Ambulatory Visit: Payer: Self-pay | Admitting: Student

## 2022-08-26 ENCOUNTER — Ambulatory Visit (INDEPENDENT_AMBULATORY_CARE_PROVIDER_SITE_OTHER): Payer: Medicare Other | Admitting: Family Medicine

## 2022-08-26 ENCOUNTER — Encounter: Payer: Self-pay | Admitting: Family Medicine

## 2022-08-26 ENCOUNTER — Telehealth: Payer: Self-pay

## 2022-08-26 VITALS — BP 118/72 | HR 65 | Ht 68.0 in | Wt 168.0 lb

## 2022-08-26 DIAGNOSIS — R39198 Other difficulties with micturition: Secondary | ICD-10-CM | POA: Diagnosis not present

## 2022-08-26 DIAGNOSIS — E782 Mixed hyperlipidemia: Secondary | ICD-10-CM

## 2022-08-26 DIAGNOSIS — R309 Painful micturition, unspecified: Secondary | ICD-10-CM | POA: Diagnosis not present

## 2022-08-26 LAB — POCT URINALYSIS DIP (MANUAL ENTRY)
Bilirubin, UA: NEGATIVE
Blood, UA: NEGATIVE
Glucose, UA: NEGATIVE mg/dL
Ketones, POC UA: NEGATIVE mg/dL
Nitrite, UA: NEGATIVE
Protein Ur, POC: NEGATIVE mg/dL
Spec Grav, UA: 1.02 (ref 1.010–1.025)
Urobilinogen, UA: 0.2 E.U./dL
pH, UA: 5.5 (ref 5.0–8.0)

## 2022-08-26 LAB — POCT UA - MICROSCOPIC ONLY: RBC, Urine, Miroscopic: NONE SEEN (ref 0–2)

## 2022-08-26 MED ORDER — TAMSULOSIN HCL 0.4 MG PO CAPS: 0.8000 mg | ORAL_CAPSULE | Freq: Every day | ORAL | 3 refills | Status: DC

## 2022-08-26 NOTE — Assessment & Plan Note (Addendum)
Patient has 2 week history of acute on chronic worsening of difficulty urinating likely due to BPH. Denies symptoms consistent with UTI or kidney stone, and UA is negative. Culture pending. Plan to increase Flomax as below, patient instructed to follow-up in 2 to 4 weeks if this does not improve symptoms.  Consider urology referral or decreasing doxepin due to its anticholinergic effects at that time.

## 2022-08-26 NOTE — Telephone Encounter (Signed)
Patient calls nurse line regarding increased discomfort with urinating. He states that this  has been going on for a few weeks, however, has worsened over the last day.   States that yesterday he was experiencing increased pressure and dribbling.   He reports history of BPH and is currently taking tamsulosin.   Advised appointment for further evaluation. Patient requesting to see male provider. PCP does not have availability today. Scheduled this morning with Dr. Ruben Im.   Talbot Grumbling, RN

## 2022-08-26 NOTE — Progress Notes (Signed)
    SUBJECTIVE:   CHIEF COMPLAINT / HPI: difficulty urinating  States last 2 weeks off and on he has felt he is taking a while to urinate and felt some resistance or pressure. No bloody discharge or stones. Felt urgency but urinating little. States he had kidney stone before. Started Flomax couple years ago which improve prior symptoms. Does not have frequency throughout the night. On doxepin, has migraines once a week. No flank pain, nausea or vomiting. No bowel movement changes. Patient does not have concern for STI.  PERTINENT  PMH / PSH: Voiding difficulty, Migraine, HTN  OBJECTIVE:   BP 118/72   Pulse 65   Ht '5\' 8"'$  (1.727 m)   Wt 168 lb (76.2 kg)   SpO2 99%   BMI 25.54 kg/m   General: NAD  Respiratory: normal WOB on RA Abdomen: soft, NTTP, no rebound or guarding Extremities: Moving all 4 extremities equally   ASSESSMENT/PLAN:   Voiding difficulty Assessment & Plan: Patient has 2 week history of acute on chronic worsening of difficulty urinating likely due to BPH. Denies symptoms consistent with UTI or kidney stone, and UA is negative. Culture pending. Plan to increase Flomax as below, patient instructed to follow-up in 2 to 4 weeks if this does not improve symptoms.  Consider urology referral or decreasing doxepin due to its anticholinergic effects at that time.  Orders: -     POCT urinalysis dipstick -     POCT UA - Microscopic Only -     Urine Culture -     Tamsulosin HCl; Take 2 capsules (0.8 mg total) by mouth daily.  Dispense: 90 capsule; Refill: 3   Return if symptoms worsen or fail to improve.  Salvadore Oxford, MD [place]

## 2022-08-26 NOTE — Patient Instructions (Addendum)
It was great to see you! Thank you for allowing me to participate in your care!  I recommend that you always bring your medications to each appointment as this makes it easy to ensure we are on the correct medications and helps [place] not miss when refills are needed.  Our plans for today:  - I have increased your Tamsulosin dose to 0.'8mg'$  daily. Please take 2 capsules. If this does not improve your symptoms in 2-4 weeks please return to discuss further treatment.  Please arrive 15 minutes PRIOR to your next scheduled appointment time! If you do not, this affects OTHER patients' care.  Take care and seek immediate care sooner if you develop any concerns.   Dr. Salvadore Oxford, MD [place]

## 2022-08-29 LAB — URINE CULTURE

## 2022-08-31 ENCOUNTER — Telehealth: Payer: Self-pay

## 2022-08-31 DIAGNOSIS — N3000 Acute cystitis without hematuria: Secondary | ICD-10-CM

## 2022-08-31 MED ORDER — SULFAMETHOXAZOLE-TRIMETHOPRIM 800-160 MG PO TABS: 1.0000 | ORAL_TABLET | Freq: Two times a day (BID) | ORAL | 0 refills | Status: DC

## 2022-08-31 NOTE — Telephone Encounter (Signed)
Called patient to discuss results of positive E. Coli on urine culture, after clinic visit for urinary hesitancy. Explained urinary tract infection to patient and treatment with antibiotics. Prescription sent to pharmacy. Patient verbalized understanding. While unlikely, there is possibility of chronic prostatitis also explaining patient's symptoms. Discussed with patient that he should make appointment with his PCP to discuss how his symptoms are doing after antibiotic treatment. Antibiotic escalated to Bactrim, for this reason.

## 2022-08-31 NOTE — Telephone Encounter (Signed)
Patient calls nurse line regarding results from recent urine culture.   He is asking if he will be being prescribed abx.   Will forward to Dr. Ruben Im.   Talbot Grumbling, RN

## 2022-09-02 MED ORDER — AMOXICILLIN 875 MG PO TABS: 875.0000 mg | ORAL_TABLET | Freq: Two times a day (BID) | ORAL | 0 refills | Status: AC

## 2022-09-02 NOTE — Telephone Encounter (Signed)
Patient complaining of neck stiffness, jaw tightness, and headache since start of TMP-SMX. Recommend stop TMP-SMX.  Start Amoxicillin 875 mg twice a day x 7 days.  If symptoms worsen or fail to resolve, let our office know.

## 2022-09-02 NOTE — Addendum Note (Signed)
Addended byWendy Poet, Ashara Lounsbury D on: 09/02/2022 12:18 PM   Modules accepted: Orders

## 2022-09-02 NOTE — Telephone Encounter (Signed)
Patient returns call to nurse line. He has taken 2 tablets of medication.    He states that last night he started experiencing a lot of tightness in neck with slight amount of pain. Also feels at times that jaws are tight.   No fever. Recent vitals of 98.4, BP: 132/81  No shortness of breath, chest pain or difficulties with swallowing. Reports slight headache.   Precepted with Dr. McDiarmid. He is going to send in alternative abx. Please send to [facility] on [address].   Advised of return precautions. Answered all questions.   Talbot Grumbling, RN

## 2022-09-10 ENCOUNTER — Encounter: Payer: Self-pay | Admitting: Student

## 2022-09-10 ENCOUNTER — Ambulatory Visit (INDEPENDENT_AMBULATORY_CARE_PROVIDER_SITE_OTHER): Payer: Medicare Other | Admitting: Student

## 2022-09-10 VITALS — BP 132/80 | HR 63 | Wt 171.4 lb

## 2022-09-10 DIAGNOSIS — Z23 Encounter for immunization: Secondary | ICD-10-CM | POA: Diagnosis not present

## 2022-09-10 DIAGNOSIS — M542 Cervicalgia: Secondary | ICD-10-CM | POA: Diagnosis not present

## 2022-09-10 DIAGNOSIS — E782 Mixed hyperlipidemia: Secondary | ICD-10-CM

## 2022-09-10 DIAGNOSIS — I10 Essential (primary) hypertension: Secondary | ICD-10-CM

## 2022-09-10 DIAGNOSIS — R39198 Other difficulties with micturition: Secondary | ICD-10-CM

## 2022-09-10 DIAGNOSIS — G43819 Other migraine, intractable, without status migrainosus: Secondary | ICD-10-CM | POA: Diagnosis not present

## 2022-09-10 DIAGNOSIS — Z131 Encounter for screening for diabetes mellitus: Secondary | ICD-10-CM

## 2022-09-10 LAB — POCT GLYCOSYLATED HEMOGLOBIN (HGB A1C): Hemoglobin A1C: 5.6 % (ref 4.0–5.6)

## 2022-09-10 MED ORDER — DOXEPIN HCL 25 MG PO CAPS: ORAL_CAPSULE | ORAL | 0 refills | Status: DC

## 2022-09-10 NOTE — Progress Notes (Signed)
  SUBJECTIVE:   CHIEF COMPLAINT / HPI:   Voiding difficulty: Presenting for follow-up of visit on 1/31 regarding difficulty urinating.  Culture grew E. coli, patient did not tolerate Bactrim well and was transition to amoxicillin.  He completed these antibiotics.  Notes improvement in his symptoms.  Neck pain: endorses 4/10, has history of spinal fusion. Notes he has had to sleep some nights on the sofa which has perhaps exacerbated. He is using capsaicin cream, muscle relaxer, heating pad, tylenol, ibuprofen. Also endorses death in close family and stress which may be exacerbating the pain.  He has been otherwise well without further symptoms.  He has asked me to evaluate if his septum is deviated.  He states that in the winter he gets some difficulty and has had septum repair in the past.  He is just hoping to make sure.  He is amenable to getting labs done for his chronic conditions while he is here.  PERTINENT  PMH / PSH: HTN, HLD, CAD, migraines, BPH  OBJECTIVE:  BP 132/80   Pulse 63   Wt 171 lb 6.4 oz (77.7 kg)   SpO2 95%   BMI 26.06 kg/m  General: Alert and oriented, NAD, conversationally appropriate HEENT: No sign of deviated septum Neck: Normal ROM, no cervical point tenderness CV: RRR, no murmurs auscultated Pulm: CTAB, normal WOB  ASSESSMENT/PLAN:  Voiding difficulty Assessment & Plan: Resolved with antibiotics for E. coli positive urine culture and increased dosage of Flomax.  Symptomatology sounds more like BPH rather than complicated UTI/prostatitis.  Shared decision making approach is to taper off doxepin.  Will hold off on urology referral for now.  Continue Flomax 0.8 mg daily.  Recheck PSA in 1 month.  It would not be worthwhile to check currently if this was a situation of prostatitis as it would be falsely elevated.  Orders: -     PSA; Future  Neck pain, musculoskeletal Assessment & Plan: No red flag signs or symptoms.  I do not suspect this is related to the  Bactrim and is much more likely to be musculoskeletal in origin.  Continue conservative management.   Mixed hyperlipidemia -     Lipid panel  Primary hypertension -     Basic metabolic panel  Screening for diabetes mellitus (DM) -     POCT glycosylated hemoglobin (Hb A1C)  Encounter for immunization [employer] Fall 2023 Covid-19 Vaccine 48yr and older  Other migraine without status migrainosus, intractable -     Doxepin HCl; Take 1 daily at bedtime.  Dispense: 30 capsule; Refill: 0  Return if symptoms worsen or fail to improve. AWells Guiles DO 09/10/2022, 3:38 PM PGY-2, [place]

## 2022-09-10 NOTE — Patient Instructions (Addendum)
It was great to see you today! Thank you for choosing [facility] for your primary care. Jose Shea. was seen for voiding difficulty.  Today we addressed: Neck pain: I'm not greatly concerned about this.  I would continue with the conservative management you are doing. Voiding difficulty: I am glad this is resolved.  We are going to taper your dosage of doxepin.  I am going to prescribe you half the dosage for 1 month and then after that you may stop taking this altogether.  We should check your PSA in 1 month.  Would not be a good idea to check this now given the UTI that you had.  If you haven't already, sign up for My Chart to have easy access to your labs results, and communication with your primary care physician.  We are checking some labs today. If they are abnormal, I will call you. If they are normal, I will send you a MyChart message (if it is active) or a letter in the mail. If you do not hear about your labs in the next 2 weeks, please call the office. I recommend that you always bring your medications to each appointment as this makes it easy to ensure you are on the correct medications and helps [place] not miss refills when you need them. Call the clinic at [phone] if your symptoms worsen or you have any concerns.  You should return to our clinic Return if symptoms worsen or fail to improve. Please arrive 15 minutes before your appointment to ensure smooth check in process.  We appreciate your efforts in making this happen.  Thank you for allowing me to participate in your care, Jose Guiles, DO 09/10/2022, 2:23 PM PGY-2, [place]

## 2022-09-10 NOTE — Assessment & Plan Note (Signed)
Resolved with antibiotics for E. coli positive urine culture and increased dosage of Flomax.  Symptomatology sounds more like BPH rather than complicated UTI/prostatitis.  Shared decision making approach is to taper off doxepin.  Will hold off on urology referral for now.  Continue Flomax 0.8 mg daily.  Recheck PSA in 1 month.  It would not be worthwhile to check currently if this was a situation of prostatitis as it would be falsely elevated.

## 2022-09-10 NOTE — Assessment & Plan Note (Signed)
No red flag signs or symptoms.  I do not suspect this is related to the Bactrim and is much more likely to be musculoskeletal in origin.  Continue conservative management.

## 2022-09-11 LAB — BASIC METABOLIC PANEL
BUN/Creatinine Ratio: 17 (ref 10–24)
BUN: 16 mg/dL (ref 8–27)
CO2: 24 mmol/L (ref 20–29)
Calcium: 9.3 mg/dL (ref 8.6–10.2)
Chloride: 103 mmol/L (ref 96–106)
Creatinine, Ser: 0.95 mg/dL (ref 0.76–1.27)
Glucose: 87 mg/dL (ref 70–99)
Potassium: 4.5 mmol/L (ref 3.5–5.2)
Sodium: 141 mmol/L (ref 134–144)
eGFR: 89 mL/min/{1.73_m2} (ref >59)

## 2022-09-11 LAB — LIPID PANEL
Chol/HDL Ratio: 3.4 ratio (ref 0.0–5.0)
Cholesterol, Total: 120 mg/dL (ref 100–199)
HDL: 35 mg/dL — ABNORMAL LOW (ref >39)
LDL Chol Calc (NIH): 53 mg/dL (ref 0–99)
Triglycerides: 192 mg/dL — ABNORMAL HIGH (ref 0–149)
VLDL Cholesterol Cal: 32 mg/dL (ref 5–40)

## 2022-09-24 ENCOUNTER — Other Ambulatory Visit: Payer: Self-pay | Admitting: Neurology

## 2022-10-03 ENCOUNTER — Other Ambulatory Visit: Payer: Self-pay | Admitting: Student

## 2022-10-03 DIAGNOSIS — G43819 Other migraine, intractable, without status migrainosus: Secondary | ICD-10-CM

## 2022-10-06 ENCOUNTER — Other Ambulatory Visit: Payer: Medicare Other

## 2022-10-06 DIAGNOSIS — R39198 Other difficulties with micturition: Secondary | ICD-10-CM | POA: Diagnosis not present

## 2022-10-07 LAB — PSA: Prostate Specific Ag, Serum: 0.4 ng/mL (ref 0.0–4.0)

## 2022-10-08 ENCOUNTER — Telehealth: Payer: Self-pay

## 2022-10-08 ENCOUNTER — Other Ambulatory Visit: Payer: Self-pay | Admitting: Student

## 2022-10-08 NOTE — Telephone Encounter (Signed)
Patient calls nurse line in regards to recent Doxepin refill.   Patient reports he was told to taper off of this medication ~ 1 month ago at office visit. He reports he has been taking (1) '25mg'$  tablet each day since. He reports "possibly" missing one or two doses.   Patient is requesting PCP advisement on continuing medication or stopping "at this point."   Will forward to PCP.

## 2022-10-09 NOTE — Telephone Encounter (Signed)
Patient returns call to nurse line. Advised patient to stop medication per note from Dr. Madison Hickman.   Talbot Grumbling, RN

## 2022-10-21 ENCOUNTER — Other Ambulatory Visit: Payer: Self-pay | Admitting: Neurology

## 2022-11-08 ENCOUNTER — Other Ambulatory Visit: Payer: Self-pay | Admitting: Student

## 2022-11-20 ENCOUNTER — Other Ambulatory Visit: Payer: Self-pay | Admitting: Student

## 2022-11-21 NOTE — Progress Notes (Unsigned)
  SUBJECTIVE:   CHIEF COMPLAINT / HPI:   Neck popping: feels like he is getting an elbow in the back of his neck/base of skull. He felt a pop. Felt it 4-5 times in the last 2-3 weeks. Endorses tight muscles in the past but does not feel this is normal. Denies falls, recent traumas. Denies pain/discomfort currently. Endorses difficulty falling asleep but that is in general, not related to pain or discomfort for this. He has applied heat.   PERTINENT  PMH / PSH: HTN, HLD, CAD, migraines, BPH  Past Medical History:  Diagnosis Date   Blood dyscrasia    Thrombocytopenia from taking  a medication   Chest pain    Chronic headaches    Gastric ulcer    GERD (gastroesophageal reflux disease)    Gross hematuria    Hypertension    Lumbar back pain    Migraine    Pneumonia    PONV (postoperative nausea and vomiting)    Restless leg    Voiding difficulty    Weak urinary stream     Patient Care Team: Shelby Mattocks, DO as PCP - General (Family Medicine) OBJECTIVE:  BP 128/76   Pulse 60   Ht 5\' 8"  (1.727 m)   Wt 168 lb 6.4 oz (76.4 kg)   SpO2 98%   BMI 25.61 kg/m  Physical Exam   ASSESSMENT/PLAN:  There are no diagnoses linked to this encounter. No follow-ups on file. Shelby Mattocks, DO 11/23/2022, 10:51 AM PGY-***, [facility] {    This will disappear when note is signed, click to select method of visit    :1}

## 2022-11-23 ENCOUNTER — Encounter: Payer: Self-pay | Admitting: Student

## 2022-11-23 ENCOUNTER — Ambulatory Visit (INDEPENDENT_AMBULATORY_CARE_PROVIDER_SITE_OTHER): Payer: Medicare Other | Admitting: Student

## 2022-11-23 VITALS — BP 128/76 | HR 60 | Ht 68.0 in | Wt 168.4 lb

## 2022-11-23 DIAGNOSIS — Z0000 Encounter for general adult medical examination without abnormal findings: Secondary | ICD-10-CM | POA: Diagnosis not present

## 2022-11-23 DIAGNOSIS — M542 Cervicalgia: Secondary | ICD-10-CM | POA: Diagnosis not present

## 2022-11-23 DIAGNOSIS — N400 Enlarged prostate without lower urinary tract symptoms: Secondary | ICD-10-CM

## 2022-11-23 NOTE — Progress Notes (Signed)
  SUBJECTIVE:   CHIEF COMPLAINT / HPI:   Neck popping: feels like he is getting an elbow in the back of his neck/base of skull. He felt a pop. Felt it 4-5 times in the last 2-3 weeks. Endorses tight muscles in the past but does not feel this is normal. Denies falls, recent traumas. Denies pain/discomfort currently. Endorses difficulty falling asleep but that is in general, not related to pain or discomfort for this. He has applied heat.  No accompanying headaches.  BPH: The increase in Flomax has worked very well for him.  He is happy about this.  PERTINENT  PMH / PSH: HTN, HLD, CAD, migraines, BPH  Patient Care Team: Shelby Mattocks, DO as PCP - General (Family Medicine) OBJECTIVE:  BP 128/76   Pulse 60   Ht 5\' 8"  (1.727 m)   Wt 168 lb 6.4 oz (76.4 kg)   SpO2 98%   BMI 25.61 kg/m  General: Well-appearing, NAD MSK: Normal ROM of neck with the exception of baseline deficit of left rotation status post cervical spine procedure.  No hypertonicity appreciated, nor midline or paraspinal cervical tenderness Neuro: Strength appropriate in bilateral upper extremities, no focal deficit appreciated  ASSESSMENT/PLAN:  Neck pain, musculoskeletal Assessment & Plan: No pain, recent trauma, discomfort, decreased range of motion.  Reassurance provided that popping sensation is nonconcerning at this time but should he develop any associating symptomatology, please return for further evaluation.   Benign prostatic hyperplasia without lower urinary tract symptoms Assessment & Plan: Reviewed PSA, normal.  Doing well on Flomax 0.8 mg daily.  Discussed that should he need further intervention, urology would be next step.   Healthcare maintenance Assessment & Plan: Up-to-date on vaccinations.  We have discussed that he should have the pneumonia vaccine when he turns 67.   Return in about 4 months (around 03/25/2023) for Annual physical. Shelby Mattocks, DO 11/23/2022, 1:16 PM PGY-2, [facility]

## 2022-11-23 NOTE — Assessment & Plan Note (Signed)
Reviewed PSA, normal.  Doing well on Flomax 0.8 mg daily.  Discussed that should he need further intervention, urology would be next step.

## 2022-11-23 NOTE — Patient Instructions (Signed)
It was great to see you today! Thank you for choosing [facility] for your primary care. Jose Base. was seen for follow-up.  Today we addressed: Your PSA was normal.  I am glad the increase in Flomax is helping.  At any point should your symptoms worsen, we may consider urology referral. Neck popping sensation: Given you are not having any compromise to your strength or range of motion, and is not causing any pain or discomfort, I would not lose any sleep over this.  Should any of these things change we may consider further evaluation. I would ultimately recommend the pneumonia vaccine for you however you will need to wait until you are 67 years old to receive.  We can do this at your next annual physical  If you haven't already, sign up for My Chart to have easy access to your labs results, and communication with your primary care physician.  Call the clinic at [phone] if your symptoms worsen or you have any concerns.  You should return to our clinic Return in about 4 months (around 03/25/2023) for Annual physical. Please arrive 15 minutes before your appointment to ensure smooth check in process.  We appreciate your efforts in making this happen.  Thank you for allowing me to participate in your care, Jose Mattocks, DO 11/23/2022, 11:01 AM PGY-2, [facility]

## 2022-11-23 NOTE — Assessment & Plan Note (Signed)
No pain, recent trauma, discomfort, decreased range of motion.  Reassurance provided that popping sensation is nonconcerning at this time but should he develop any associating symptomatology, please return for further evaluation.

## 2022-11-23 NOTE — Assessment & Plan Note (Signed)
Up-to-date on vaccinations.  We have discussed that he should have the pneumonia vaccine when he turns 67.

## 2023-01-15 ENCOUNTER — Other Ambulatory Visit: Payer: Self-pay | Admitting: Student

## 2023-03-01 ENCOUNTER — Other Ambulatory Visit: Payer: Self-pay | Admitting: Student

## 2023-03-08 ENCOUNTER — Ambulatory Visit (INDEPENDENT_AMBULATORY_CARE_PROVIDER_SITE_OTHER): Payer: Medicare Other

## 2023-03-08 VITALS — Ht 68.0 in | Wt 168.0 lb

## 2023-03-08 DIAGNOSIS — Z0000 Encounter for general adult medical examination without abnormal findings: Secondary | ICD-10-CM

## 2023-03-08 NOTE — Patient Instructions (Addendum)
[name] , Thank you for taking time to come for your Medicare Wellness Visit. I appreciate your ongoing commitment to your health goals. Please review the following plan we discussed and let me know if I can assist you in the future.   Referrals/Orders/Follow-Ups/Clinician Recommendations: Aim for 30 minutes of exercise or brisk walking, 6-8 glasses of water, and 5 servings of fruits and vegetables each day.  This is a list of the screening recommended for you and due dates:  Health Maintenance  Topic Date Due   Flu Shot  02/25/2023   Medicare Annual Wellness Visit  03/07/2024   DTaP/Tdap/Td vaccine (4 - Td or Tdap) 05/27/2024   Colon Cancer Screening  10/09/2028   COVID-19 Vaccine  Completed   Hepatitis C Screening  Completed   HIV Screening  Completed   Zoster (Shingles) Vaccine  Completed   HPV Vaccine  Aged Out    Advanced directives: (ACP Link)Information on Advanced Care Planning can be found at [facility] Advance Health Care Directives Advance Health Care Directives ([email or web address]   Next Medicare Annual Wellness Visit scheduled for next year: Yes  Preventive Care 40-64 Years, Male Preventive care refers to lifestyle choices and visits with your health care provider that can promote health and wellness. What does preventive care include? A yearly physical exam. This is also called an annual well check. Dental exams once or twice a year. Routine eye exams. Ask your health care provider how often you should have your eyes checked. Personal lifestyle choices, including: Daily care of your teeth and gums. Regular physical activity. Eating a healthy diet. Avoiding tobacco and drug use. Limiting alcohol use. Practicing safe sex. Taking low-dose aspirin every day starting at age 32. What happens during an annual well check? The services and screenings done by your health care provider during your annual well check will depend on your age, overall health,  lifestyle risk factors, and family history of disease. Counseling  Your health care provider may ask you questions about your: Alcohol use. Tobacco use. Drug use. Emotional well-being. Home and relationship well-being. Sexual activity. Eating habits. Work and work Astronomer. Screening  You may have the following tests or measurements: Height, weight, and BMI. Blood pressure. Lipid and cholesterol levels. These may be checked every 5 years, or more frequently if you are over 61 years old. Skin check. Lung cancer screening. You may have this screening every year starting at age 53 if you have a 30-pack-year history of smoking and currently smoke or have quit within the past 15 years. Fecal occult blood test (FOBT) of the stool. You may have this test every year starting at age 54. Flexible sigmoidoscopy or colonoscopy. You may have a sigmoidoscopy every 5 years or a colonoscopy every 10 years starting at age 64. Prostate cancer screening. Recommendations will vary depending on your family history and other risks. Hepatitis C blood test. Hepatitis B blood test. Sexually transmitted disease (STD) testing. Diabetes screening. This is done by checking your blood sugar (glucose) after you have not eaten for a while (fasting). You may have this done every 1-3 years. Discuss your test results, treatment options, and if necessary, the need for more tests with your health care provider. Vaccines  Your health care provider may recommend certain vaccines, such as: Influenza vaccine. This is recommended every year. Tetanus, diphtheria, and acellular pertussis (Tdap, Td) vaccine. You may need a Td booster every 10 years. Zoster vaccine. You may need this after age 75.  Pneumococcal 13-valent conjugate (PCV13) vaccine. You may need this if you have certain conditions and have not been vaccinated. Pneumococcal polysaccharide (PPSV23) vaccine. You may need one or two doses if you smoke cigarettes or  if you have certain conditions. Talk to your health care provider about which screenings and vaccines you need and how often you need them. This information is not intended to replace advice given to you by your health care provider. Make sure you discuss any questions you have with your health care provider. Document Released: 08/09/2015 Document Revised: 04/01/2016 Document Reviewed: 05/14/2015 Elsevier Interactive Patient Education  2017 [employer].  Fall Prevention in the Home Falls can cause injuries. They can happen to people of all ages. There are many things you can do to make your home safe and to help prevent falls. What can I do on the outside of my home? Regularly fix the edges of walkways and driveways and fix any cracks. Remove anything that might make you trip as you walk through a door, such as a raised step or threshold. Trim any bushes or trees on the path to your home. Use bright outdoor lighting. Clear any walking paths of anything that might make someone trip, such as rocks or tools. Regularly check to see if handrails are loose or broken. Make sure that both sides of any steps have handrails. Any raised decks and porches should have guardrails on the edges. Have any leaves, snow, or ice cleared regularly. Use sand or salt on walking paths during winter. Clean up any spills in your garage right away. This includes oil or grease spills. What can I do in the bathroom? Use night lights. Install grab bars by the toilet and in the tub and shower. Do not use towel bars as grab bars. Use non-skid mats or decals in the tub or shower. If you need to sit down in the shower, use a plastic, non-slip stool. Keep the floor dry. Clean up any water that spills on the floor as soon as it happens. Remove soap buildup in the tub or shower regularly. Attach bath mats securely with double-sided non-slip rug tape. Do not have throw rugs and other things on the floor that can make you  trip. What can I do in the bedroom? Use night lights. Make sure that you have a light by your bed that is easy to reach. Do not use any sheets or blankets that are too big for your bed. They should not hang down onto the floor. Have a firm chair that has side arms. You can use this for support while you get dressed. Do not have throw rugs and other things on the floor that can make you trip. What can I do in the kitchen? Clean up any spills right away. Avoid walking on wet floors. Keep items that you use a lot in easy-to-reach places. If you need to reach something above you, use a strong step stool that has a grab bar. Keep electrical cords out of the way. Do not use floor polish or wax that makes floors slippery. If you must use wax, use non-skid floor wax. Do not have throw rugs and other things on the floor that can make you trip. What can I do with my stairs? Do not leave any items on the stairs. Make sure that there are handrails on both sides of the stairs and use them. Fix handrails that are broken or loose. Make sure that handrails are as long as the  stairways. Check any carpeting to make sure that it is firmly attached to the stairs. Fix any carpet that is loose or worn. Avoid having throw rugs at the top or bottom of the stairs. If you do have throw rugs, attach them to the floor with carpet tape. Make sure that you have a light switch at the top of the stairs and the bottom of the stairs. If you do not have them, ask someone to add them for you. What else can I do to help prevent falls? Wear shoes that: Do not have high heels. Have rubber bottoms. Are comfortable and fit you well. Are closed at the toe. Do not wear sandals. If you use a stepladder: Make sure that it is fully opened. Do not climb a closed stepladder. Make sure that both sides of the stepladder are locked into place. Ask someone to hold it for you, if possible. Clearly mark and make sure that you can  see: Any grab bars or handrails. First and last steps. Where the edge of each step is. Use tools that help you move around (mobility aids) if they are needed. These include: Canes. Walkers. Scooters. Crutches. Turn on the lights when you go into a dark area. Replace any light bulbs as soon as they burn out. Set up your furniture so you have a clear path. Avoid moving your furniture around. If any of your floors are uneven, fix them. If there are any pets around you, be aware of where they are. Review your medicines with your doctor. Some medicines can make you feel dizzy. This can increase your chance of falling. Ask your doctor what other things that you can do to help prevent falls. This information is not intended to replace advice given to you by your health care provider. Make sure you discuss any questions you have with your health care provider. Document Released: 05/09/2009 Document Revised: 12/19/2015 Document Reviewed: 08/17/2014 Elsevier Interactive Patient Education  2017 [employer].

## 2023-03-08 NOTE — Progress Notes (Addendum)
Subjective:   Jose Shea. is a 67 y.o. male who presents for an Initial Medicare Annual Wellness Visit.  Visit Complete: Virtual  I connected with  Jose Shea. on 03/08/23 by a audio enabled telemedicine application and verified that I am speaking with the correct person using two identifiers.  Patient Location: Home  Provider Location: Home Office  I discussed the limitations of evaluation and management by telemedicine. The patient expressed understanding and agreed to proceed.  Patient Medicare AWV questionnaire was completed by the patient on 03/04/23; I have confirmed that all information answered by patient is correct and no changes since this date.  Vital Signs: Unable to obtain new vitals due to this being a telehealth visit.  Review of Systems     Cardiac Risk Factors include: advanced age (>73men, >46 women);male gender;hypertension;dyslipidemia     Objective:    Today's Vitals   03/08/23 1131  Weight: 168 lb (76.2 kg)  Height: 5\' 8"  (1.727 m)   Body mass index is 25.54 kg/m.     03/08/2023    1:28 PM 03/08/2023   11:39 AM 11/23/2022   10:23 AM 08/26/2022   10:10 AM 12/24/2021   10:09 AM 07/05/2021    4:39 AM 04/08/2021   10:29 AM  Advanced Directives  Does Patient Have a Medical Advance Directive? No No No No No No No  Would patient like information on creating a medical advance directive? Yes (MAU/Ambulatory/Procedural Areas - Information given) Yes (MAU/Ambulatory/Procedural Areas - Information given) No - Patient declined No - Patient declined No - Patient declined  No - Patient declined    Current Medications (verified) Outpatient Encounter Medications as of 03/08/2023  Medication Sig   atorvastatin (LIPITOR) 20 MG tablet TAKE 1 TABLET BY MOUTH EVERY DAY   cetirizine (ZYRTEC) 10 MG tablet Take 10 mg by mouth daily.   cyclobenzaprine (FLEXERIL) 10 MG tablet TAKE 1 TABLET BY MOUTH EVERYDAY AT BEDTIME   esomeprazole (NEXIUM) 20 MG capsule Take 1  capsule (20 mg total) by mouth daily at 12 noon. (Patient taking differently: Take 20 mg by mouth as needed.)   fluticasone (FLONASE) 50 MCG/ACT nasal spray USE 2 SPRAYS INTO EACH NOSTRIL DAILY   lisinopril (ZESTRIL) 20 MG tablet TAKE 1 TABLET BY MOUTH EVERY DAY   magnesium oxide (MAG-OX) 400 MG tablet Take 400 mg by mouth at bedtime as needed (restless legs).   Menthol, Topical Analgesic, 4 % GEL Apply 1 application topically daily as needed (joint pain).   Milk Thistle 250 MG CAPS Take by mouth daily at 6 (six) AM.   Multiple Vitamin (MULTIVITAMIN WITH MINERALS) TABS tablet Take 1 tablet by mouth daily.   oxymetazoline (AFRIN) 0.05 % nasal spray Place 1-2 sprays into both nostrils at bedtime.   propranolol ER (INDERAL LA) 80 MG 24 hr capsule TAKE 2 CAPSULES BY MOUTH AT BEDTIME   rOPINIRole (REQUIP) 0.25 MG tablet TAKE 1 TABLET BY MOUTH 1-2 HOURS BEFORE BEDTIME FOR RESTLESS LEG SYNDROME AS NEEDED   saw palmetto 500 MG capsule Take 500 mg by mouth daily.   SUMAtriptan (IMITREX) 100 MG tablet Take 1 tablet (100 mg total) by mouth every 2 (two) hours as needed. Max 2x a day. MUST BE SEEN FOR FURTHER REFILLS.   tamsulosin (FLOMAX) 0.4 MG CAPS capsule Take 2 capsules (0.8 mg total) by mouth daily.   tetrahydrozoline 0.05 % ophthalmic solution Place 1 drop into both eyes daily as needed (dry eye).   No facility-administered  encounter medications on file as of 03/08/2023.    Allergies (verified) Fluoxetine hcl, Quinine, and Ham   History: Past Medical History:  Diagnosis Date   Blood dyscrasia    Thrombocytopenia from taking  a medication   Chest pain    Chronic headaches    Gastric ulcer    GERD (gastroesophageal reflux disease)    Gross hematuria    Hypertension    Lumbar back pain    Migraine    Pneumonia    PONV (postoperative nausea and vomiting)    Restless leg    Voiding difficulty    Weak urinary stream    Past Surgical History:  Procedure Laterality Date   COLONOSCOPY      HERNIA REPAIR     LUMBAR LAMINECTOMY/DECOMPRESSION MICRODISCECTOMY N/A 12/18/2015   Procedure: LUMBAR 4-5 DECOMPRESSION;  Surgeon: Estill Bamberg, MD;  Location: [facility] OR;  Service: Orthopedics;  Laterality: N/A;  LUMBAR 4-5 DECOMPRESSION   NASAL SINUS SURGERY  2005   Family History  Problem Relation Age of Onset   Migraines Paternal Uncle    Social History   Socioeconomic History   Marital status: Single    Spouse name: Not on file   Number of children: Not on file   Years of education: Not on file   Highest education level: Bachelor's degree (e.g., BA, AB, BS)  Occupational History   Not on file  Tobacco Use   Smoking status: Never   Smokeless tobacco: Never  Vaping Use   Vaping status: Never Used  Substance and Sexual Activity   Alcohol use: Yes    Alcohol/week: 7.0 standard drinks of alcohol    Types: 5 Cans of beer, 2 Shots of liquor per week    Comment: one drink a day   Drug use: No   Sexual activity: Not on file  Other Topics Concern   Not on file  Social History Narrative   Not on file   Social Determinants of Health   Financial Resource Strain: Low Risk  (03/04/2023)   Overall Financial Resource Strain (CARDIA)    Difficulty of Paying Living Expenses: Not hard at all  Food Insecurity: No Food Insecurity (03/04/2023)   Hunger Vital Sign    Worried About Running Out of Food in the Last Year: Never true    Ran Out of Food in the Last Year: Never true  Transportation Needs: No Transportation Needs (03/04/2023)   PRAPARE - Administrator, Civil Service (Medical): No    Lack of Transportation (Non-Medical): No  Physical Activity: Sufficiently Active (03/04/2023)   Exercise Vital Sign    Days of Exercise per Week: 3 days    Minutes of Exercise per Session: 50 min  Stress: No Stress Concern Present (03/04/2023)   [employer] of Occupational Health - Occupational Stress Questionnaire    Feeling of Stress : Not at all  Social Connections: Moderately Isolated  (03/04/2023)   Social Connection and Isolation Panel [NHANES]    Frequency of Communication with Friends and Family: More than three times a week    Frequency of Social Gatherings with Friends and Family: Twice a week    Attends Religious Services: More than 4 times per year    Active Member of [employer] or Organizations: No    Attends Banker Meetings: Never    Marital Status: Never married    Tobacco Counseling Counseling given: Not Answered   Clinical Intake:  Pre-visit preparation completed: Yes  Pain : No/denies pain  Diabetes: No  How often do you need to have someone help you when you read instructions, pamphlets, or other written materials from your doctor or pharmacy?: 1 - Never  Interpreter Needed?: No  Information entered by :: Kandis Fantasia LPN   Activities of Daily Living    03/04/2023    8:50 AM  In your present state of health, do you have any difficulty performing the following activities:  Hearing? 0  Vision? 1  Difficulty concentrating or making decisions? 0  Walking or climbing stairs? 1  Dressing or bathing? 0  Doing errands, shopping? 0  Preparing Food and eating ? N  Using the Toilet? N  In the past six months, have you accidently leaked urine? Y  Do you have problems with loss of bowel control? N  Managing your Medications? N  Managing your Finances? N  Housekeeping or managing your Housekeeping? N    Patient Care Team: Shelby Mattocks, DO as PCP - General (Family Medicine) Luane School, OD (Optometry)  Indicate any recent Medical Services you may have received from other than [facility] providers in the past year (date may be approximate).     Assessment:   This is a routine wellness examination for Jose Shea.  Hearing/Vision screen Hearing Screening - Comments:: Denies hearing difficulties   Vision Screening - Comments:: Wears rx glasses - up to date with routine eye exams with Dr. Shearon Stalls      Dietary issues and exercise  activities discussed:     Goals Addressed             This Visit's Progress    Remain active and independent         Depression Screen    03/08/2023    1:26 PM 11/23/2022   10:26 AM 09/10/2022    2:04 PM 08/26/2022   10:11 AM 12/24/2021   10:10 AM 04/08/2021   10:30 AM 09/12/2020   10:11 AM  PHQ 2/9 Scores  PHQ - 2 Score 0 0 0 0 0 0 2  PHQ- 9 Score  4 2 3 2 3 7     Fall Risk    03/04/2023    8:50 AM 04/08/2021   10:31 AM 09/06/2019    9:33 AM 12/06/2018   10:19 AM 02/23/2018    9:18 AM  Fall Risk   Falls in the past year? 0 0 0 0 No  Number falls in past yr: 0  0 0   Injury with Fall? 0        MEDICARE RISK AT HOME:   TIMED UP AND GO:  Was the test performed? No    Cognitive Function:        03/08/2023   11:40 AM  6CIT Screen  What Year? 0 points  What month? 0 points  What time? 0 points  Count back from 20 0 points  Months in reverse 0 points  Repeat phrase 0 points  Total Score 0 points    Immunizations Immunization History  Administered Date(s) Administered   COVID-19, mRNA, vaccine(Comirnaty)12 years and older 09/10/2022   Influenza Inj Mdck Quad With Preservative 04/28/2021   Influenza Split 06/01/2011   Influenza Whole 04/20/2008, 06/12/2009, 05/20/2010   Influenza,inj,Quad PF,6+ Mos 04/28/2013, 04/23/2015, 03/26/2017, 05/22/2018, 03/23/2019   Influenza-Unspecified 04/26/2017, 03/23/2019, 05/10/2020   PFIZER Comirnaty(Gray Top)Covid-19 Tri-Sucrose Vaccine 12/25/2020   PFIZER(Purple Top)SARS-COV-2 Vaccination 10/12/2019, 11/06/2019, 06/08/2020   Respiratory Syncytial Virus Vaccine,Recomb Aduvanted(Arexvy) 07/11/2022   Td 06/26/1996, 07/18/2007   Tdap 05/27/2014   Unspecified SARS-COV-2  Vaccination 04/28/2021   Zoster Recombinant(Shingrix) 09/06/2019, 11/28/2019    TDAP status: Up to date  Flu Vaccine status: Due, Education has been provided regarding the importance of this vaccine. Advised may receive this vaccine at local pharmacy or Health  Dept. Aware to provide a copy of the vaccination record if obtained from local pharmacy or Health Dept. Verbalized acceptance and understanding.  Pneumococcal vaccine status: Up to date  Covid-19 vaccine status: Information provided on how to obtain vaccines.   Qualifies for Shingles Vaccine? Yes   Zostavax completed No   Shingrix Completed?: Yes  Screening Tests Health Maintenance  Topic Date Due   INFLUENZA VACCINE  02/25/2023   Medicare Annual Wellness (AWV)  03/07/2024   DTaP/Tdap/Td (4 - Td or Tdap) 05/27/2024   Colonoscopy  10/09/2028   COVID-19 Vaccine  Completed   Hepatitis C Screening  Completed   HIV Screening  Completed   Zoster Vaccines- Shingrix  Completed   HPV VACCINES  Aged Out    Health Maintenance  Health Maintenance Due  Topic Date Due   INFLUENZA VACCINE  02/25/2023    Colorectal cancer screening: Type of screening: Colonoscopy. Completed 10/10/18. Repeat every 10 years  Lung Cancer Screening: (Low Dose CT Chest recommended if Age 65-80 years, 20 pack-year currently smoking OR have quit w/in 15years.) does not qualify.   Lung Cancer Screening Referral: n/a  Additional Screening:  Hepatitis C Screening: does qualify; Completed 03/11/15  Vision Screening: Recommended annual ophthalmology exams for early detection of glaucoma and other disorders of the eye. Is the patient up to date with their annual eye exam?  Yes  Who is the provider or what is the name of the office in which the patient attends annual eye exams? Dr. Shearon Stalls If pt is not established with a provider, would they like to be referred to a provider to establish care? No .   Dental Screening: Recommended annual dental exams for proper oral hygiene  Community Resource Referral / Chronic Care Management: CRR required this visit?  No   CCM required this visit?  No    Plan:     I have personally reviewed and noted the following in the patient's chart:   Medical and social  history Use of alcohol, tobacco or illicit drugs  Current medications and supplements including opioid prescriptions. Patient is not currently taking opioid prescriptions. Functional ability and status Nutritional status Physical activity Advanced directives List of other physicians Hospitalizations, surgeries, and ER visits in previous 12 months Vitals Screenings to include cognitive, depression, and falls Referrals and appointments  In addition, I have reviewed and discussed with patient certain preventive protocols, quality metrics, and best practice recommendations. A written personalized care plan for preventive services as well as general preventive health recommendations were provided to patient.     Kandis Fantasia [place], [place]   9/81/1912   After Visit Summary: (MyChart) Due to this being a telephonic visit, the after visit summary with patients personalized plan was offered to patient via MyChart   Nurse Notes: Patient complaining of right elbow pain x a couple months; wondering if it may be related to Flomax.      I have reviewed this visit and agree with the documentation.  Marshall L Chambliss

## 2023-03-09 ENCOUNTER — Other Ambulatory Visit: Payer: Self-pay | Admitting: Student

## 2023-03-09 DIAGNOSIS — G43819 Other migraine, intractable, without status migrainosus: Secondary | ICD-10-CM

## 2023-03-22 ENCOUNTER — Other Ambulatory Visit: Payer: Self-pay | Admitting: Family Medicine

## 2023-03-22 DIAGNOSIS — R39198 Other difficulties with micturition: Secondary | ICD-10-CM

## 2023-03-31 ENCOUNTER — Other Ambulatory Visit: Payer: Self-pay

## 2023-03-31 ENCOUNTER — Ambulatory Visit (INDEPENDENT_AMBULATORY_CARE_PROVIDER_SITE_OTHER): Payer: Medicare Other | Admitting: Student

## 2023-03-31 ENCOUNTER — Encounter: Payer: Self-pay | Admitting: Student

## 2023-03-31 VITALS — BP 135/91 | HR 59 | Ht 68.0 in | Wt 167.8 lb

## 2023-03-31 DIAGNOSIS — G43819 Other migraine, intractable, without status migrainosus: Secondary | ICD-10-CM

## 2023-03-31 DIAGNOSIS — N400 Enlarged prostate without lower urinary tract symptoms: Secondary | ICD-10-CM

## 2023-03-31 DIAGNOSIS — M25521 Pain in right elbow: Secondary | ICD-10-CM | POA: Diagnosis not present

## 2023-03-31 DIAGNOSIS — Z23 Encounter for immunization: Secondary | ICD-10-CM

## 2023-03-31 DIAGNOSIS — Z0000 Encounter for general adult medical examination without abnormal findings: Secondary | ICD-10-CM

## 2023-03-31 MED ORDER — SUMATRIPTAN SUCCINATE 100 MG PO TABS: 100.0000 mg | ORAL_TABLET | ORAL | 0 refills | Status: DC | PRN

## 2023-03-31 NOTE — Patient Instructions (Signed)
It was great to see you today! Thank you for choosing [facility] for your primary care.  Today we addressed: We discussed that you are full code and an organ donor and I will get your paperwork scanned into your chart. I will look into beta sitosterol for you and get back to you regarding your BPH. Your right elbow pain is most likely to be related to musculoskeletal origin, I do not suspect this is related to your neck.  Please follow-up as needed for this. You received the flu and pneumococcal vaccine today.   Things to do to Keep yourself Healthy  - Exercise at least 30-45 minutes a day, 3-4 days a week. >150 min of moderate intensity per week is advised. - Eat a low-fat diet with lots of fruits and vegetables, up to 7-9 servings per day. - Seatbelts can save your life. Wear them always. - Smoke detectors on every level of your home, check batteries every year. - Eye Doctor - have an eye exam every 1-2 years. - Safe sex - if you may be exposed to STDs, use a condom. - Alcohol If you drink, do it moderately, less than 2 drinks per day. - Health Care Power of Attorney.  Choose someone to speak for you if you are not able. - Depression is common in our stressful world.If you're feeling down or losing interest in things you normally enjoy, please come in for a visit.   If you haven't already, sign up for My Chart to have easy access to your labs results, and communication with your primary care physician.  Return if symptoms worsen or fail to improve. Please arrive 15 minutes before your appointment to ensure smooth check in process.  We appreciate your efforts in making this happen.  Thank you for allowing me to participate in your care, Shelby Mattocks, DO 03/31/2023, 10:47 AM PGY-3, [facility]

## 2023-03-31 NOTE — Assessment & Plan Note (Signed)
Received flu and pneumococcal vaccine today. He provided copies of his advance directive paperwork.  He is also full code and organ donor.  CMA to get these scanned in the chart.  He does not want life-prolonging measures should he be in a vegetative state or have a terminal disease.

## 2023-03-31 NOTE — Progress Notes (Unsigned)
    SUBJECTIVE:   Chief compliant/HPI: annual examination  Jose Shea. is a 67 y.o. who presents today for an annual exam.   History tabs reviewed and updated.   Notes some right elbow pain, he has been spraying round up a lot recently so thinks that may be the cause. Pain is intermittent on the medial portion of elbow.   READ ABOUT BETA SITOSTEROL FOR BPH***  FULL CODE*** ORGAN DONOR***  OBJECTIVE:  BP (!) 135/91   Pulse (!) 59   Ht 5\' 8"  (1.727 m)   Wt 167 lb 12.8 oz (76.1 kg)   SpO2 98%   BMI 25.51 kg/m   ***  ASSESSMENT/PLAN:  There are no diagnoses linked to this encounter.   Annual Examination  See AVS for age appropriate recommendations.  PHQ score ***, reviewed and discussed.  Blood pressure value is *** goal, discussed.   Considered the following screening exams based upon USPSTF recommendations: Diabetes screening: {discussed/ordered:14545} Screening for elevated cholesterol: {discussed/ordered:14545} HIV testing: {discussed/ordered:14545} Hepatitis C: {discussed/ordered:14545} Hepatitis B: {discussed/ordered:14545} Syphilis if at high risk: {discussed/ordered:14545} Reviewed risk factors for latent tuberculosis and {not indicated/requested/declined:14582} Colorectal cancer screening: {crcscreen:23821::"discussed, colonoscopy ordered"} Lung cancer screening: {discussed/declined/written DGUY:40347} See documentation below regarding discussion and indication.  PSA discussed and after engaging in discussion of possible risks, benefits and complications of screening patient elected to ***.   Follow up in 1 year or sooner if indicated.   Shelby Mattocks, DO [facility]

## 2023-04-01 ENCOUNTER — Encounter: Payer: Self-pay | Admitting: Student

## 2023-04-01 DIAGNOSIS — Z0000 Encounter for general adult medical examination without abnormal findings: Secondary | ICD-10-CM | POA: Insufficient documentation

## 2023-04-01 NOTE — Assessment & Plan Note (Signed)
Quite unremarkable physical exam, no history of trauma.  Likely MSK pain.  Treat conservatively, return should this worsen or not improve.

## 2023-04-01 NOTE — Assessment & Plan Note (Addendum)
PHQ score 4, reviewed and discussed.  Up-to-date on diabetes and colorectal cancer screening.  Does not meet criteria for lung cancer screening.  He has up-to-date PSA testing. He provided copies of his advance directive paperwork.  He is also full code and organ donor.  CMA to get these scanned in the chart.  He does not want life-prolonging measures should he be in a vegetative state or have a terminal disease.

## 2023-04-01 NOTE — Assessment & Plan Note (Signed)
Reviewed past neurology note. Agreed to refill Imitrex as he does not frequently require usage.

## 2023-04-01 NOTE — Assessment & Plan Note (Signed)
Currently taking Flomax 0.8 mg daily, recently also started taking supplement with an acting adrenal base as sterile.  I shared with him that I am not greatly familiar with this ingredient but will look into it for him.  See separate patient message.

## 2023-04-19 ENCOUNTER — Telehealth: Payer: Self-pay

## 2023-04-19 NOTE — Telephone Encounter (Signed)
Patient calls nurse line regarding issues with tightness and pressure with urination. He reports that this has been going since last Thursday. Intermittent slight pain with urination.   Denies fever, chills, abdominal/back pain, hematuria or noticeable stones.   He has been taking 0.8 mg tamsulosin daily.   Advised that patient should schedule office visit for evaluation.   Scheduled for 11:00 tomorrow morning.   ED precautions discussed.   Veronda Prude, RN

## 2023-04-20 ENCOUNTER — Ambulatory Visit: Payer: Medicare Other

## 2023-04-20 VITALS — BP 142/93 | HR 66 | Ht 68.0 in | Wt 166.4 lb

## 2023-04-20 DIAGNOSIS — N390 Urinary tract infection, site not specified: Secondary | ICD-10-CM

## 2023-04-20 DIAGNOSIS — Z23 Encounter for immunization: Secondary | ICD-10-CM

## 2023-04-20 DIAGNOSIS — R350 Frequency of micturition: Secondary | ICD-10-CM | POA: Diagnosis not present

## 2023-04-20 LAB — POCT URINALYSIS DIP (MANUAL ENTRY)
Bilirubin, UA: NEGATIVE
Blood, UA: NEGATIVE
Glucose, UA: NEGATIVE mg/dL
Ketones, POC UA: NEGATIVE mg/dL
Nitrite, UA: NEGATIVE
Protein Ur, POC: NEGATIVE mg/dL
Spec Grav, UA: 1.015 (ref 1.010–1.025)
Urobilinogen, UA: 0.2 E.U./dL
pH, UA: 7 (ref 5.0–8.0)

## 2023-04-20 LAB — POCT UA - MICROSCOPIC ONLY: RBC, Urine, Miroscopic: NONE SEEN (ref 0–2)

## 2023-04-20 MED ORDER — CEPHALEXIN 500 MG PO CAPS
500.0000 mg | ORAL_CAPSULE | Freq: Two times a day (BID) | ORAL | 0 refills | Status: AC
Start: 2023-04-20 — End: 2023-05-04

## 2023-04-20 NOTE — Patient Instructions (Signed)
It was great to see you! Thank you for allowing me to participate in your care!  Our plans for today:  -I sent the antibiotic Keflex to your pharmacy you will take this twice a day for the next 14 days. -I have ordered a referral to urology, with a should call you to schedule an appointment. -Please follow-up if your symptoms are not improving.   Please arrive 15 minutes PRIOR to your next scheduled appointment time! If you do not, this affects OTHER patients' care.  Take care and seek immediate care sooner if you develop any concerns.   Celine Mans, MD, PGY-2 [facility] 11:31 AM 04/20/2023  [facility]

## 2023-04-20 NOTE — Assessment & Plan Note (Signed)
Patient with known BPH and history of urinary tract infection treated earlier this year.  Given clinical history I suspect he has repeat urinary tract infection.  He reassuringly does not have red flag symptoms to suggest systemic infection.    Recommended to patient that he be treated empirically to which she is agreeable.  UA with trace bacteria and leukocytes.  Start Keflex 500 mg twice daily for 14 days.  Follow-up urine culture.  Discussed that given BPH and repeat UTIs this year that patient would benefit from referral to urology for possible surgical management and further recommendations, referral placed.

## 2023-04-20 NOTE — Progress Notes (Signed)
    SUBJECTIVE:   CHIEF COMPLAINT / HPI: urinary pressure  Has felt his urination has been better with Flomax, but has more pressure and some pain recently when he urinates. Had some burning. No fevers or chills. No flank pain. Noticed some brown in his urine one time. No blood. Has had one kidney stone before. Urinating every 45 minutes.  Has history of BPH, takes Flomax 0.8 mg daily.  PERTINENT  PMH / PSH: BPH  OBJECTIVE:   BP (!) 142/93   Pulse 66   Ht 5\' 8"  (1.727 m)   Wt 166 lb 6 oz (75.5 kg)   SpO2 98%   BMI 25.30 kg/m   General: NAD, well appearing Neuro: A&O Respiratory: normal WOB on RA Abdomen: Soft, nontender to palpation, no rebound or guarding Extremities: Moving all 4 extremities equally   ASSESSMENT/PLAN:   Assessment & Plan Complicated UTI (urinary tract infection) Patient with known BPH and history of urinary tract infection treated earlier this year.  Given clinical history I suspect he has repeat urinary tract infection.  He reassuringly does not have red flag symptoms to suggest systemic infection.    Recommended to patient that he be treated empirically to which she is agreeable.  UA with trace bacteria and leukocytes.  Start Keflex 500 mg twice daily for 14 days.  Follow-up urine culture.  Discussed that given BPH and repeat UTIs this year that patient would benefit from referral to urology for possible surgical management and further recommendations, referral placed. Encounter for immunization Patient agreeable to COVID-vaccine today. No follow-ups on file.  Celine Mans, MD [facility]

## 2023-04-22 LAB — URINE CULTURE

## 2023-04-23 LAB — URINE CULTURE

## 2023-05-04 ENCOUNTER — Other Ambulatory Visit: Payer: Self-pay | Admitting: Student

## 2023-05-13 ENCOUNTER — Telehealth: Payer: Self-pay

## 2023-05-13 NOTE — Telephone Encounter (Signed)
Patient calls nurse line checking the status of urology referral.   He reports he has not "heard anything from anyone."   Advised referral was faxed to [facility] on 9/26. I gave patient their office information to call and schedule his first apt.  Of note, patient reports continued dysuria. He denies any fevers or chills. He reports he felt much better after taking a round of antibiotics. He is requesting more.   Patient advised he would need another apt for antibiotics.   Patient reports he will call [facility] to see when the first available apt is. He will call us to schedule if he can not get a Urology apt within the next couple of weeks.

## 2023-05-15 IMAGING — CT CT HEART MORP W/ CTA COR W/ SCORE W/ CA W/CM &/OR W/O CM
4 of 7 series · 8 of 20 positions shown, 9 images · IV contrast (APPLIED)
Comparison: None.

Addendum:
CLINICAL DATA: 65 yo male with chest pain

EXAM:
Cardiac/Coronary CTA
TECHNIQUE: A non-contrast, gated CT scan was obtained with axial slices of 3 mm
through the heart for calcium scoring. Calcium scoring was performed
using the Agatston method. A 120 kV prospective, gated, contrast
cardiac scan was obtained. Gantry rotation speed was 250 msecs and
collimation was 0.6 mm. Two sublingual nitroglycerin tablets (0.8
mg) were given. The 3D data set was reconstructed in 5% intervals of
the 35-75% of the R-R cycle. Diastolic phases were analyzed on a
dedicated workstation using MPR, MIP, and VRT modes. The patient
received 95 cc of contrast.

[Series 6: best syst · axial · 0.39mm/px · z∈[-214,-175]mm · 2 of 288 slices shown, 3 images]
[im 96/288  vessel]
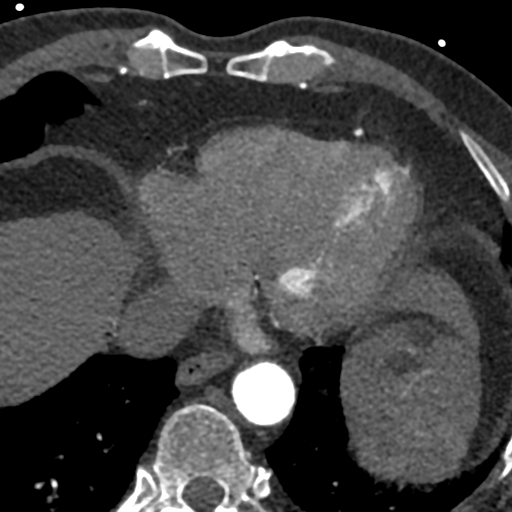
[im 96/288  lung]
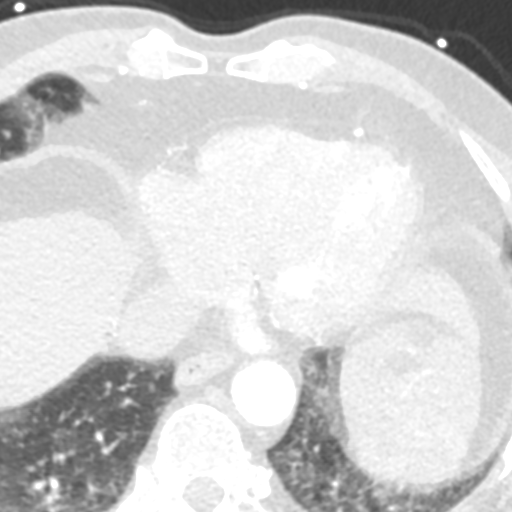
[im 192/288  vessel]
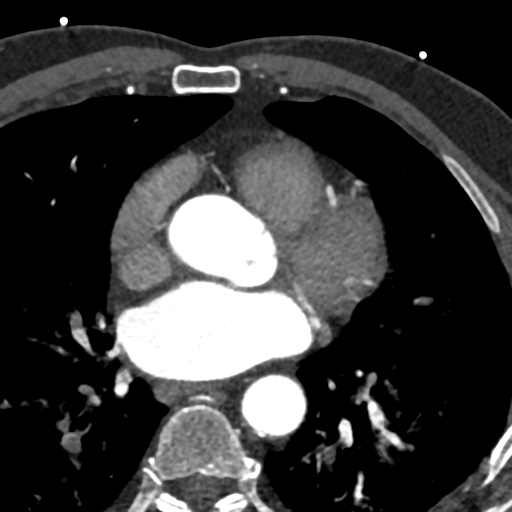

[Series 7: best diast · axial · 0.39mm/px · z∈[-214,-175]mm · 2 of 288 slices shown]
[im 96/288  vessel]
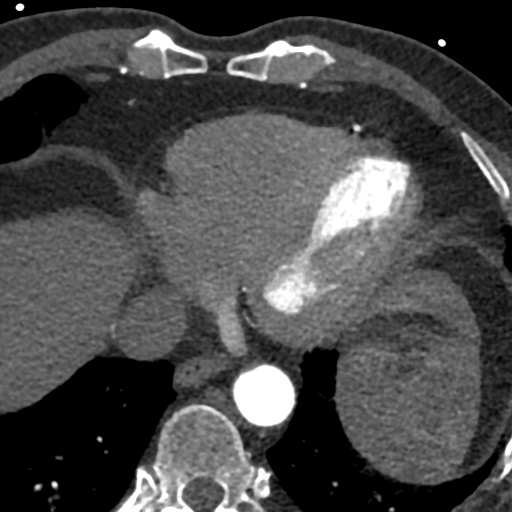
[im 192/288  vessel]
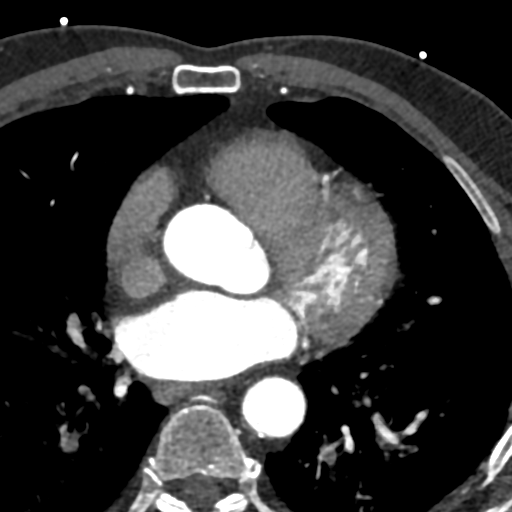

[Series 8: ts diast sharp · axial · 0.39mm/px · z∈[-214,-175]mm · 2 of 288 slices shown]
[im 96/288  lung]
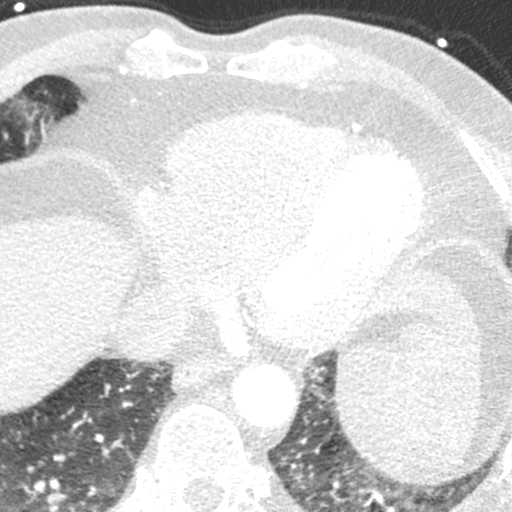
[im 192/288  lung]
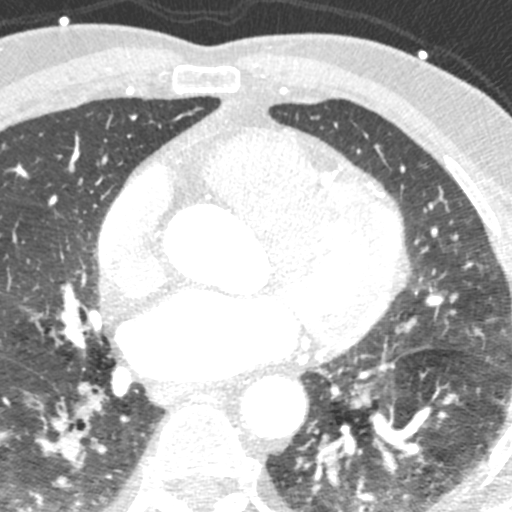

[Series 9: ts syst sharp · axial · 0.39mm/px · z∈[-214,-175]mm · 2 of 288 slices shown]
[im 96/288  lung]
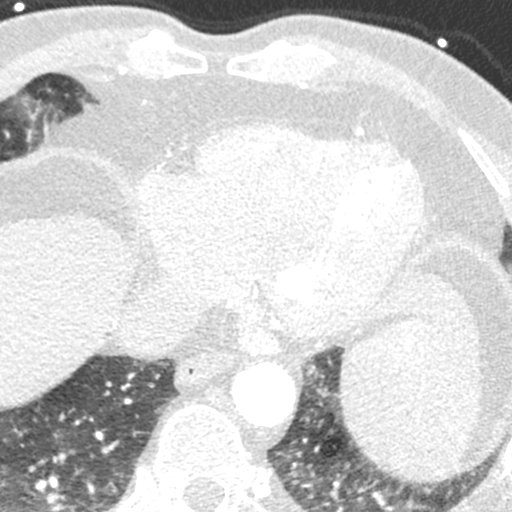
[im 192/288  lung]
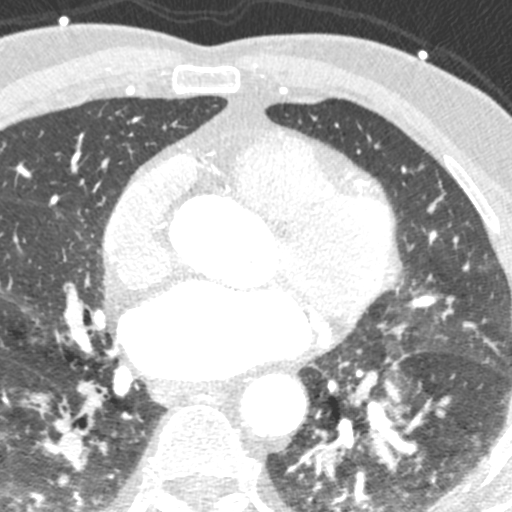

[8 of 20 positions shown; findings below may reference images not displayed]

FINDINGS: Image quality: Average.

Noise artifact is: Misregistration.

Coronary Arteries:  Normal coronary origin.  Left dominance.

Left main: The left main is a large caliber vessel with a normal
take off from the left coronary cusp that bifurcates to form a left
anterior descending artery and a left circumflex artery. There is no
plaque or stenosis.

Left anterior descending artery: The LAD has minimal (0-24)
calcified plaque in the proximal and mid vessel. The LAD gives off
medium size D1 with minimal (0-24) ostial calcified plaque and small
D2.

Left circumflex artery: The LCX is dominant with minimal (0-24)
calcified plaque in the proximal vessel. The LCX gives off large,
patent OM1 and smaller patent OM2; terminates in patent
posterolateral and PDA.

Right coronary artery: The RCA is non-dominant with normal take off
from the right coronary cusp. There is no evidence of plaque or
stenosis.

Right Atrium: Right atrial size is within normal limits.

Right Ventricle: The right ventricular cavity is within normal
limits.

Left Atrium: Left atrial size is normal in size with no left atrial
appendage filling defect.

Left Ventricle: The ventricular cavity size is within normal limits.
There are no stigmata of prior infarction. There is no abnormal
filling defect.

Pulmonary arteries: Normal in size without proximal filling defect.

Pulmonary veins: Normal pulmonary venous drainage.

Pericardium: Normal thickness with no significant effusion or
calcium present.

Cardiac valves: The aortic valve is trileaflet without significant
calcification. The mitral valve is normal structure without
significant calcification.

Aorta: Normal caliber; aortic atherosclerosis noted.

Extra-cardiac findings: See attached radiology report for
non-cardiac structures.
IMPRESSION: 1. Coronary calcium score of 79.3. This was 56 percentile for age-,
sex, and race-matched controls.

2. Normal coronary origin with left dominance.

3. Minimal CAD as outlined above.

4. Aortic atherosclerosis.

RECOMMENDATIONS:
CAD-RADS 1: Minimal non-obstructive CAD (0-24%). Consider
non-atherosclerotic causes of chest pain. Consider preventive
therapy and risk factor modification

EXAM:
OVER-READ INTERPRETATION  CT CHEST

The following report is an over-read performed by radiologist Dr.
does not include interpretation of cardiac or coronary anatomy or
pathology. The cardiac/coronary CTA interpretation by the
cardiologist is attached.
FINDINGS: Visualized portions of the lower airway and esophagus appear normal.
No mediastinal mass or adenopathy identified.

No pleural effusion, airspace consolidation, or atelectasis. No
suspicious lung nodules.

No acute findings within the imaged portions of the upper abdomen.

No acute or suspicious osseous findings.
IMPRESSION: Negative over-read.

*** End of Addendum ***
FINDINGS: Image quality: Average.

Noise artifact is: Misregistration.

Coronary Arteries:  Normal coronary origin.  Left dominance.

Left main: The left main is a large caliber vessel with a normal
take off from the left coronary cusp that bifurcates to form a left
anterior descending artery and a left circumflex artery. There is no
plaque or stenosis.

Left anterior descending artery: The LAD has minimal (0-24)
calcified plaque in the proximal and mid vessel. The LAD gives off
medium size D1 with minimal (0-24) ostial calcified plaque and small
D2.

Left circumflex artery: The LCX is dominant with minimal (0-24)
calcified plaque in the proximal vessel. The LCX gives off large,
patent OM1 and smaller patent OM2; terminates in patent
posterolateral and PDA.

Right coronary artery: The RCA is non-dominant with normal take off
from the right coronary cusp. There is no evidence of plaque or
stenosis.

Right Atrium: Right atrial size is within normal limits.

Right Ventricle: The right ventricular cavity is within normal
limits.

Left Atrium: Left atrial size is normal in size with no left atrial
appendage filling defect.

Left Ventricle: The ventricular cavity size is within normal limits.
There are no stigmata of prior infarction. There is no abnormal
filling defect.

Pulmonary arteries: Normal in size without proximal filling defect.

Pulmonary veins: Normal pulmonary venous drainage.

Pericardium: Normal thickness with no significant effusion or
calcium present.

Cardiac valves: The aortic valve is trileaflet without significant
calcification. The mitral valve is normal structure without
significant calcification.

Aorta: Normal caliber; aortic atherosclerosis noted.

Extra-cardiac findings: See attached radiology report for
non-cardiac structures.
IMPRESSION: 1. Coronary calcium score of 79.3. This was 56 percentile for age-,
sex, and race-matched controls.

2. Normal coronary origin with left dominance.

3. Minimal CAD as outlined above.

4. Aortic atherosclerosis.

RECOMMENDATIONS:
CAD-RADS 1: Minimal non-obstructive CAD (0-24%). Consider
non-atherosclerotic causes of chest pain. Consider preventive
therapy and risk factor modification

## 2023-05-20 NOTE — Progress Notes (Cosign Needed Addendum)
    SUBJECTIVE:   CHIEF COMPLAINT / HPI: UTI sxs  For past 2 weeks has been having Dysuria- Yes  Frequency- Yes  Urgency- Yes  Hematuria- No Flank pain- No Suprapubic pain- Yes  Fevers- No   Issues with emptying Burning and tingling when going After September-got better with keflex and then started back up 2-3 weeks ago October the 11th 17 trips to bathroom and 19th 19 trips No vomiting  Has had 2 UTIs this year (January and last one treated 04/20/2023) Ucx at that time grew E coli without any resistance patterns. This was treated with keflex for 14 days  PERTINENT  PMH / PSH: BPH  OBJECTIVE:   BP 139/84   Pulse 60   Ht 5\' 8"  (1.727 m)   Wt 166 lb 2 oz (75.4 kg)   SpO2 100%   BMI 25.26 kg/m   General: Well appearing, NAD, awake, alert, responsive to questions Head: Normocephalic atraumatic Respiratory:  chest rises symmetrically,  no increased work of breathing Abdomen: Soft, non-tender, non-distended, normoactive bowel sounds, No CVA or suprapubic tenderness Rectal: Prostate symmetrically enlarged, no bogginess, no tenderness Extremities: Moves upper and lower extremities freely  Chaperone Tashira Legette, CMA present during rectal exam  ASSESSMENT/PLAN:   Assessment & Plan Complicated UTI (urinary tract infection) Concern for prostatitis given this is the possible third UTI with E. coli.  Prostate exam overall less concerning for prostatitis at this time.  He is scheduled to see urology on 11/11.  UA with trace leukocytes similar to previous UAs.  The strain had no resistance patterns. -Urine culture -Cefadroxil 500 mg twice daily until he sees urology on 11/11    Levin Erp, MD [facility]

## 2023-05-21 ENCOUNTER — Ambulatory Visit (INDEPENDENT_AMBULATORY_CARE_PROVIDER_SITE_OTHER): Payer: Medicare Other | Admitting: Student

## 2023-05-21 VITALS — BP 139/84 | HR 60 | Ht 68.0 in | Wt 166.1 lb

## 2023-05-21 DIAGNOSIS — N390 Urinary tract infection, site not specified: Secondary | ICD-10-CM

## 2023-05-21 DIAGNOSIS — R300 Dysuria: Secondary | ICD-10-CM | POA: Diagnosis not present

## 2023-05-21 LAB — POCT URINALYSIS DIP (MANUAL ENTRY)
Bilirubin, UA: NEGATIVE
Blood, UA: NEGATIVE
Glucose, UA: NEGATIVE mg/dL
Ketones, POC UA: NEGATIVE mg/dL
Nitrite, UA: NEGATIVE
Protein Ur, POC: NEGATIVE mg/dL
Spec Grav, UA: 1.02 (ref 1.010–1.025)
Urobilinogen, UA: 0.2 U/dL
pH, UA: 7 (ref 5.0–8.0)

## 2023-05-21 LAB — POCT UA - MICROSCOPIC ONLY

## 2023-05-21 MED ORDER — CEPHALEXIN 500 MG PO CAPS: 500.0000 mg | ORAL_CAPSULE | Freq: Two times a day (BID) | ORAL | 0 refills | Status: DC

## 2023-05-21 MED ORDER — CEFADROXIL 500 MG PO CAPS
500.0000 mg | ORAL_CAPSULE | Freq: Two times a day (BID) | ORAL | 0 refills | Status: AC
Start: 2023-05-21 — End: 2023-06-07

## 2023-05-21 NOTE — Assessment & Plan Note (Signed)
Concern for prostatitis given this is the possible third UTI with E. coli.  Prostate exam overall less concerning for prostatitis at this time.  He is scheduled to see urology on 11/11.  UA with trace leukocytes similar to previous UAs.  The strain had no resistance patterns. -Urine culture -Cefadroxil 500 mg twice daily until he sees urology on 11/11

## 2023-05-21 NOTE — Patient Instructions (Signed)
It was great to see you! Thank you for allowing me to participate in your care!   Our plans for today:  - I am sending in keflex to take until 11/11 when you see urology - I am sending your urine for culture as well  Take care and seek immediate care sooner if you develop any concerns.  Levin Erp, MD

## 2023-06-07 DIAGNOSIS — R310 Gross hematuria: Secondary | ICD-10-CM | POA: Diagnosis not present

## 2023-06-07 DIAGNOSIS — N3000 Acute cystitis without hematuria: Secondary | ICD-10-CM | POA: Diagnosis not present

## 2023-06-10 DIAGNOSIS — K4090 Unilateral inguinal hernia, without obstruction or gangrene, not specified as recurrent: Secondary | ICD-10-CM | POA: Diagnosis not present

## 2023-06-10 DIAGNOSIS — R310 Gross hematuria: Secondary | ICD-10-CM | POA: Diagnosis not present

## 2023-06-10 DIAGNOSIS — N281 Cyst of kidney, acquired: Secondary | ICD-10-CM | POA: Diagnosis not present

## 2023-06-10 DIAGNOSIS — N200 Calculus of kidney: Secondary | ICD-10-CM | POA: Diagnosis not present

## 2023-06-30 DIAGNOSIS — R310 Gross hematuria: Secondary | ICD-10-CM | POA: Diagnosis not present

## 2023-06-30 DIAGNOSIS — D414 Neoplasm of uncertain behavior of bladder: Secondary | ICD-10-CM | POA: Diagnosis not present

## 2023-07-02 ENCOUNTER — Other Ambulatory Visit: Payer: Self-pay | Admitting: Student

## 2023-07-09 ENCOUNTER — Other Ambulatory Visit: Payer: Self-pay | Admitting: Student

## 2023-07-14 ENCOUNTER — Other Ambulatory Visit: Payer: Self-pay | Admitting: Urology

## 2023-07-15 ENCOUNTER — Other Ambulatory Visit: Payer: Self-pay | Admitting: Urology

## 2023-07-16 DIAGNOSIS — D3132 Benign neoplasm of left choroid: Secondary | ICD-10-CM | POA: Diagnosis not present

## 2023-07-16 DIAGNOSIS — H2513 Age-related nuclear cataract, bilateral: Secondary | ICD-10-CM | POA: Diagnosis not present

## 2023-07-23 DIAGNOSIS — N3000 Acute cystitis without hematuria: Secondary | ICD-10-CM | POA: Diagnosis not present

## 2023-07-23 NOTE — Progress Notes (Signed)
COVID Vaccine Completed:  Yes  Date of COVID positive in last 90 days:  PCP - Shelby Mattocks, DO Cardiologist - Riley Lam, MD  Chest x-ray -  EKG -  Stress Test -  ECHO - 08-20-21 Epic Cardiac Cath -  Pacemaker/ICD device last checked: Spinal Cord Stimulator: Coronary CT - 08-28-21 Epic  Bowel Prep -   Sleep Study -  CPAP -   Fasting Blood Sugar -  Checks Blood Sugar _____ times a day  Last dose of GLP1 agonist-  N/A GLP1 instructions:  Hold 7 days before surgery    Last dose of SGLT-2 inhibitors-  N/A SGLT-2 instructions:  Hold 3 days before surgery    Blood Thinner Instructions:  Time Aspirin Instructions: Last Dose:  Activity level:  Can go up a flight of stairs and perform activities of daily living without stopping and without symptoms of chest pain or shortness of breath.  Able to exercise without symptoms  Unable to go up a flight of stairs without symptoms of     Anesthesia review:  CAD with hx of chest pain, HTN  Patient denies shortness of breath, fever, cough and chest pain at PAT appointment  Patient verbalized understanding of instructions that were given to them at the PAT appointment. Patient was also instructed that they will need to review over the PAT instructions again at home before surgery.

## 2023-07-23 NOTE — Patient Instructions (Signed)
SURGICAL WAITING ROOM VISITATION Patients having surgery or a procedure may have no more than 2 support people in the waiting area - these visitors may rotate.    Children under the age of 65 must have an adult with them who is not the patient.  If the patient needs to stay at the hospital during part of their recovery, the visitor guidelines for inpatient rooms apply. Pre-op nurse will coordinate an appropriate time for 1 support person to accompany patient in pre-op.  This support person may not rotate.    Please refer to the [facility] website for the visitor guidelines for Inpatients (after your surgery is over and you are in a regular room).       Your procedure is scheduled on:  08-05-23   Report to [facility] Main Entrance    Report to admitting at 5:15 AM   Call this number if you have problems the morning of surgery [phone]   Do not eat food or drink liquids :After Midnight.          If you have questions, please contact your surgeon's office.   FOLLOW  ANY ADDITIONAL PRE OP INSTRUCTIONS YOU RECEIVED FROM YOUR SURGEON'S OFFICE!!!     Oral Hygiene is also important to reduce your risk of infection.                                    Remember - BRUSH YOUR TEETH THE MORNING OF SURGERY WITH YOUR REGULAR TOOTHPASTE   Do NOT smoke after Midnight   Take these medicines the morning of surgery with A SIP OF WATER:   Atorvastatin  Zyrtec  Esomeprazole  Finasteride  Tamsulosin  Flonase nasal spray  If needed Imitrex and eyedrops  Stop all vitamins and herbal supplements 7 days before surgery  Bring CPAP mask and tubing day of surgery.                              You may not have any metal on your body including  jewelry, and body piercing             Do not wear  lotions, powders, cologne, or deodorant              Men may shave face and neck.   Do not bring valuables to the hospital. [facility] IS NOT RESPONSIBLE   FOR VALUABLES.   Contacts,  dentures or bridgework may not be worn into surgery.  DO NOT BRING YOUR HOME MEDICATIONS TO THE HOSPITAL. PHARMACY WILL DISPENSE MEDICATIONS LISTED ON YOUR MEDICATION LIST TO YOU DURING YOUR ADMISSION IN THE HOSPITAL!    Patients discharged on the day of surgery will not be allowed to drive home.  Someone NEEDS to stay with you for the first 24 hours after anesthesia.   Special Instructions: Bring a copy of your healthcare power of attorney and living will documents the day of surgery if you haven't scanned them before.              Please read over the following fact sheets you were given: IF YOU HAVE QUESTIONS ABOUT YOUR PRE-OP INSTRUCTIONS PLEASE CALL [phone] [facility]  If you received a COVID test during your pre-op visit  it is requested that you wear a mask when out in public, stay away from anyone that may not be  feeling well and notify your surgeon if you develop symptoms. If you test positive for Covid or have been in contact with anyone that has tested positive in the last 10 days please notify you surgeon.   - Preparing for Surgery Before surgery, you can play an important role.  Because skin is not sterile, your skin needs to be as free of germs as possible.  You can reduce the number of germs on your skin by washing with CHG (chlorahexidine gluconate) soap before surgery.  CHG is an antiseptic cleaner which kills germs and bonds with the skin to continue killing germs even after washing. Please DO NOT use if you have an allergy to CHG or antibacterial soaps.  If your skin becomes reddened/irritated stop using the CHG and inform your nurse when you arrive at Short Stay. Do not shave (including legs and underarms) for at least 48 hours prior to the first CHG shower.  You may shave your face/neck.  Please follow these instructions carefully:  1.  Shower with CHG Soap the night before surgery and the  morning of surgery.  2.  If you choose to wash your hair, wash your hair  first as usual with your normal  shampoo.  3.  After you shampoo, rinse your hair and body thoroughly to remove the shampoo.                             4.  Use CHG as you would any other liquid soap.  You can apply chg directly to the skin and wash.  Gently with a scrungie or clean washcloth.  5.  Apply the CHG Soap to your body ONLY FROM THE NECK DOWN.   Do   not use on face/ open                           Wound or open sores. Avoid contact with eyes, ears mouth and   genitals (private parts).                       Wash face,  Genitals (private parts) with your normal soap.             6.  Wash thoroughly, paying special attention to the area where your    surgery  will be performed.  7.  Thoroughly rinse your body with warm water from the neck down.  8.  DO NOT shower/wash with your normal soap after using and rinsing off the CHG Soap.                9.  Pat yourself dry with a clean towel.            10.  Wear clean pajamas.            11.  Place clean sheets on your bed the night of your first shower and do not  sleep with pets. Day of Surgery : Do not apply any lotions/deodorants the morning of surgery.  Please wear clean clothes to the hospital/surgery center.  FAILURE TO FOLLOW THESE INSTRUCTIONS MAY RESULT IN THE CANCELLATION OF YOUR SURGERY  PATIENT SIGNATURE_________________________________  NURSE SIGNATURE__________________________________  ________________________________________________________________________

## 2023-07-25 ENCOUNTER — Other Ambulatory Visit: Payer: Self-pay | Admitting: Student

## 2023-07-25 DIAGNOSIS — G43819 Other migraine, intractable, without status migrainosus: Secondary | ICD-10-CM

## 2023-07-26 ENCOUNTER — Other Ambulatory Visit: Payer: Self-pay

## 2023-07-26 ENCOUNTER — Encounter (HOSPITAL_COMMUNITY): Payer: Self-pay

## 2023-07-26 ENCOUNTER — Encounter (HOSPITAL_COMMUNITY)
Admission: RE | Admit: 2023-07-26 | Discharge: 2023-07-26 | Disposition: A | Discharge status: Home / Self Care | Payer: Medicare Other | Source: Ambulatory Visit | Attending: Urology | Admitting: Urology

## 2023-07-26 VITALS — BP 128/89 | HR 59 | Temp 99.3°F | Resp 12 | Ht 68.5 in | Wt 163.0 lb

## 2023-07-26 DIAGNOSIS — Z79899 Other long term (current) drug therapy: Secondary | ICD-10-CM | POA: Diagnosis present

## 2023-07-26 DIAGNOSIS — Z01812 Encounter for preprocedural laboratory examination: Secondary | ICD-10-CM | POA: Diagnosis present

## 2023-07-26 DIAGNOSIS — I10 Essential (primary) hypertension: Secondary | ICD-10-CM | POA: Diagnosis not present

## 2023-07-26 DIAGNOSIS — Z01818 Encounter for other preprocedural examination: Secondary | ICD-10-CM | POA: Diagnosis not present

## 2023-07-26 DIAGNOSIS — Z01810 Encounter for preprocedural cardiovascular examination: Secondary | ICD-10-CM | POA: Diagnosis present

## 2023-07-26 HISTORY — DX: Personal history of urinary calculi: Z87.442

## 2023-07-26 HISTORY — DX: Unspecified osteoarthritis, unspecified site: M19.90

## 2023-07-26 LAB — CBC
HCT: 51.4 % (ref 39.0–52.0)
Hemoglobin: 17.2 g/dL — ABNORMAL HIGH (ref 13.0–17.0)
MCH: 29.9 pg (ref 26.0–34.0)
MCHC: 33.5 g/dL (ref 30.0–36.0)
MCV: 89.4 fL (ref 80.0–100.0)
Platelets: 147 10*3/uL — ABNORMAL LOW (ref 150–400)
RBC: 5.75 MIL/uL (ref 4.22–5.81)
RDW: 12.4 % (ref 11.5–15.5)
WBC: 5.3 10*3/uL (ref 4.0–10.5)
nRBC: 0 % (ref 0.0–0.2)

## 2023-07-26 LAB — BASIC METABOLIC PANEL
Anion gap: 9 (ref 5–15)
BUN: 16 mg/dL (ref 8–23)
CO2: 28 mmol/L (ref 22–32)
Calcium: 9.6 mg/dL (ref 8.9–10.3)
Chloride: 103 mmol/L (ref 98–111)
Creatinine, Ser: 0.89 mg/dL (ref 0.61–1.24)
GFR, Estimated: >60 mL/min (ref >60)
Glucose, Bld: 110 mg/dL — ABNORMAL HIGH (ref 70–99)
Potassium: 4.7 mmol/L (ref 3.5–5.1)
Sodium: 140 mmol/L (ref 135–145)

## 2023-07-28 LAB — URINE CULTURE: Culture: 20000 — AB

## 2023-07-29 NOTE — Anesthesia Preprocedure Evaluation (Addendum)
 Anesthesia Evaluation  Patient identified by MRN, date of birth, ID band Patient awake    Reviewed: Allergy & Precautions, NPO status , Patient's Chart, lab work & pertinent test results  History of Anesthesia Complications (+) PONV and history of anesthetic complications  Airway Mallampati: II  TM Distance: >3 FB Neck ROM: Full    Dental no notable dental hx.    Pulmonary    Pulmonary exam normal        Cardiovascular hypertension, Pt. on medications and Pt. on home beta blockers + CAD   Rhythm:Regular Rate:Normal  Echo 08/20/2021:     IMPRESSIONS      1. Left ventricular ejection fraction, by estimation, is 60 to 65%. The left ventricle has normal function. The left ventricle has no regional wall motion abnormalities. Left ventricular diastolic parameters were normal.  2. Right ventricular systolic function is normal. The right ventricular size is normal. There is normal pulmonary artery systolic pressure. The estimated right ventricular systolic pressure is 33.5 mmHg.  3. The mitral valve is grossly normal. Trivial mitral valve regurgitation. No evidence of mitral stenosis.  4. The aortic valve is tricuspid. Aortic valve regurgitation is not visualized. No aortic stenosis is present.  5. The inferior vena cava is normal in size with greater than 50% respiratory variability, suggesting right atrial pressure of 3 mmHg.   Conclusion(s)/Recommendation(s): Normal biventricular function without evidence of hemodynamically significant valvular heart disease.      Neuro/Psych  Headaches  negative psych ROS   GI/Hepatic Neg liver ROS, PUD,GERD  Medicated,,  Endo/Other  negative endocrine ROS    Renal/GU  Bladder dysfunction      Musculoskeletal  (+) Arthritis , Osteoarthritis,    Abdominal Normal abdominal exam  (+)   Peds  Hematology  (+) Blood dyscrasia   Anesthesia Other Findings    Reproductive/Obstetrics                             Anesthesia Physical Anesthesia Plan  ASA: 3  Anesthesia Plan: General   Post-op Pain Management:    Induction: Intravenous  PONV Risk Score and Plan: 3 and Ondansetron , Dexamethasone , Midazolam  and Treatment may vary due to age or medical condition  Airway Management Planned: Mask and Oral ETT  Additional Equipment: None  Intra-op Plan:   Post-operative Plan: Extubation in OR  Informed Consent: I have reviewed the patients History and Physical, chart, labs and discussed the procedure including the risks, benefits and alternatives for the proposed anesthesia with the patient or authorized representative who has indicated his/her understanding and acceptance.     Dental advisory given  Plan Discussed with: CRNA  Anesthesia Plan Comments: (See PAT note from 12/30 by MARLA Senna PA-C )        Anesthesia Quick Evaluation

## 2023-08-04 MED ORDER — GENTAMICIN SULFATE 40 MG/ML IJ SOLN
5.0000 mg/kg | INTRAVENOUS | Status: AC
  Administered 2023-08-05: 370 mg via INTRAVENOUS
  Filled 2023-08-04: qty 9.25

## 2023-08-04 NOTE — H&P (Signed)
 68 year old male history of BPH and several UTIs this year.   Here today for erythematous lesion on bladder. TURBT today.   No family history of prostate cancer.  No GU surgeries.  3 left inguinal hernia surgeries.   BPH:  2024 currently on Flomax  0.8 mg, PSA 0.4 10/06/2022. Has nocturia 3 times a night. Has occasional frequency 3 times AUA times symptom score is low. Denies current gross hematuria.  06/30/23: cysto today, cystoscopy shows 2.5 to 3 cm prostatic urethra with mild lateral lobe obstruction.   UTI:  2024 has had 2 UTIs this year when patient has UTIs tingling without poor emptying and some pain. 1 UTI in January and 1 UTI in September the next 1 second versus  06/30/23 having some urgency and frequency, as well as dribbling UA today is negative for infection.   Gross hematuria:  2024 had 1 episode of gross hematuria in 2021 thought that he was passing a stone never had a workup.  06/30/23: cysto today CT negative for malignancy only non obstructing stones, erythematous lesion at the anterior right aspect of the bladder       ALLERGIES: Ham Prozac Quinine    MEDICATIONS: Finasteride 5 mg tablet 1 tablet PO Daily  Nexium  20 mg capsule,delayed release  Zyrtec 10 mg tablet  Atorvastatin  Calcium  20 mg tablet 1 tablet PO Daily  Cefadroxil  500 mg capsule 1 capsule PO BID  Cyclobenzaprine  Hcl 10 mg tablet 1 tablet PO Daily  Fluticasone  Propionate 50 mcg/actuation spray, suspension  Lisinopril  20 mg tablet 1 tablet PO Daily  Propranolol  Hcl Er 80 mg capsule, extended release 24hr 1 capsule PO Daily  Ropinirole  Hcl 0.25 mg tablet 1 tablet PO Daily  Sumatriptan  Succinate 100 mg tablet 1 tablet PO Daily  Tamsulosin  Hcl 0.4 mg capsule 2 capsule PO Daily     GU PSH: Locm 300-399Mg /Ml Iodine , - 06/10/2023     NON-GU PSH: Back surgery Dental Surgery Procedure Hernia Repair Neck Surgery Sinus surgery     GU PMH: Gross hematuria - 06/10/2023, - 06/07/2023 Acute  Cystitis/UTI - 06/07/2023 BPH w/LUTS - 06/07/2023 Nocturia - 06/07/2023    NON-GU PMH: Arthritis GERD Hypercholesterolemia Hypertension    FAMILY HISTORY: Kidney Cancer - Cousin Kidney Failure - Grandmother Kidney Stones - Cousin, Uncle Prostate Cancer - Uncle   SOCIAL HISTORY: Marital Status: Single Ethnicity: Not Hispanic Or Latino; Race: White Current Smoking Status: Patient has never smoked.   Tobacco Use Assessment Completed: Used Tobacco in last 30 days? Does drink.  Drinks 1 caffeinated drink per day.    REVIEW OF SYSTEMS:    GU Review Male:   Patient denies frequent urination, hard to postpone urination, burning/ pain with urination, get up at night to urinate, leakage of urine, stream starts and stops, trouble starting your stream, have to strain to urinate , erection problems, and penile pain.  Gastrointestinal (Upper):   Patient denies nausea, vomiting, and indigestion/ heartburn.  Gastrointestinal (Lower):   Patient denies diarrhea and constipation.  Constitutional:   Patient denies fever, night sweats, weight loss, and fatigue.  Skin:   Patient denies skin rash/ lesion and itching.  Eyes:   Patient denies blurred vision and double vision.  Ears/ Nose/ Throat:   Patient denies sore throat and sinus problems.  Hematologic/Lymphatic:   Patient denies swollen glands and easy bruising.  Cardiovascular:   Patient denies leg swelling and chest pains.  Respiratory:   Patient denies cough and shortness of breath.  Endocrine:   Patient denies  excessive thirst.  Musculoskeletal:   Patient denies back pain and joint pain.  Neurological:   Patient denies headaches and dizziness.  Psychologic:   Patient denies depression and anxiety.   VITAL SIGNS: None   Complexity of Data:   06/10/23  PSA  Total PSA 0.67 ng/mL    PROCEDURES:         Flexible Cystoscopy - 52000  Risks, benefits, and some of the potential complications of the procedure were discussed at length with  the patient including infection, bleeding, voiding discomfort, urinary retention, fever, chills, sepsis, and others. All questions were answered. Informed consent was obtained. Sterile technique and intraurethral analgesia were used.  Meatus:  Normal size. Normal location. Normal condition.  Urethra:  No strictures.  External Sphincter:  Normal.  Verumontanum:  Normal.  Prostate:  Mildly obstructing 2.5 to 3 cm PU   Bladder Neck:  Non-obstructing.  Ureteral Orifices:  Normal location. Normal size. Normal shape. Effluxed clear urine.  Bladder:  Irregular erythema on anterior bladder wall concerning for possible malignancy, no other abnormalities noted in the bladder       The lower urinary tract was carefully examined. The procedure was well-tolerated and without complications. Antibiotic instructions were given. Instructions were given to call the office immediately for bloody urine, difficulty urinating, urinary retention, painful or frequent urination, fever, chills, nausea, vomiting or other illness. The patient stated that he understood these instructions and would comply with them.         Urinalysis w/Scope Dipstick Dipstick Cont'd Micro  Color: Yellow Bilirubin: Neg mg/dL WBC/hpf: 6 - 89/yeq  Appearance: Clear Ketones: Neg mg/dL RBC/hpf: 0 - 2/hpf  Specific Gravity: 1.015 Blood: Trace ery/uL Bacteria: Rare (0-9/hpf)  pH: 7.0 Protein: 1+ mg/dL Cystals: NS (Not Seen)  Glucose: Neg mg/dL Urobilinogen: 0.2 mg/dL Casts: NS (Not Seen)    Nitrites: Neg Trichomonas: Not Present    Leukocyte Esterase: 1+ leu/uL Mucous: Not Present      Epithelial Cells: 0 - 5/hpf      Yeast: NS (Not Seen)      Sperm: Not Present    ASSESSMENT:      ICD-10 Details  1 GU:   BPH w/LUTS - N40.1 Chronic, Stable  2   Gross hematuria - R31.0 Chronic, Stable  3   Nocturia - R35.1 Chronic, Stable  4   Bladder tumor/neoplasm - D41.4 Undiagnosed New Problem     PLAN:            Medications New Meds:  Cephalexin  500 mg tablet 1 tablet PO BID   #20  0 Refill(s)  Pharmacy Name:  [facility] [phone]  Address:  [address] [place] [place], [place] [address]  Phone:  [phone]  Fax:  [phone]            Orders Labs Urine Culture, Mycobacteria          Document Letter(s):  Created for Patient: Clinical Summary         Notes:   Urgency and frequency: UA is negative for infection today but will start on antibiotics and get culture call back with results.   Bladder irregularity: Erythema at the right anterior wall the bladder concerning for possible malignancy will plan for TURBT next available patient agreeable to the plan offered TUIP for prostate as well patient is not interested at this time.   We discussed risk benefits alternatives to transurethral resection of bladder tumor. Benefits are diagnostic and therapeutic which include removal of bladder  tumor and relieve the possible irritative symptoms from bladder tumor. We discussed risk including not being able to remove all of the tumor, possibility of perforation of the bladder requiring long-term catheter or operative intervention to repair. The need for catheter postoperatively was also discussed, as well as postoperative lower urinary tract symptoms in the immediate postop period. Patient voiced their understanding and would like to proceed with the surgery.   Plan For TURBT today.

## 2023-08-05 ENCOUNTER — Ambulatory Visit (HOSPITAL_BASED_OUTPATIENT_CLINIC_OR_DEPARTMENT_OTHER): Payer: Medicare Other | Admitting: Anesthesiology

## 2023-08-05 ENCOUNTER — Other Ambulatory Visit: Payer: Self-pay

## 2023-08-05 ENCOUNTER — Ambulatory Visit (HOSPITAL_COMMUNITY)
Admission: RE | Admit: 2023-08-05 | Discharge: 2023-08-05 | Disposition: A | Discharge status: Home / Self Care | Payer: Medicare Other | Attending: Urology | Admitting: Urology

## 2023-08-05 ENCOUNTER — Ambulatory Visit (HOSPITAL_COMMUNITY): Payer: Medicare Other | Admitting: Medical

## 2023-08-05 ENCOUNTER — Encounter (HOSPITAL_COMMUNITY)
Admission: RE | Disposition: A | Discharge status: Home / Self Care | Payer: Self-pay | Source: Home / Self Care | Attending: Urology

## 2023-08-05 ENCOUNTER — Encounter (HOSPITAL_COMMUNITY): Payer: Self-pay | Admitting: Urology

## 2023-08-05 DIAGNOSIS — I2510 Atherosclerotic heart disease of native coronary artery without angina pectoris: Secondary | ICD-10-CM | POA: Insufficient documentation

## 2023-08-05 DIAGNOSIS — N3289 Other specified disorders of bladder: Secondary | ICD-10-CM

## 2023-08-05 DIAGNOSIS — K219 Gastro-esophageal reflux disease without esophagitis: Secondary | ICD-10-CM | POA: Insufficient documentation

## 2023-08-05 DIAGNOSIS — Z79899 Other long term (current) drug therapy: Secondary | ICD-10-CM | POA: Insufficient documentation

## 2023-08-05 DIAGNOSIS — N138 Other obstructive and reflux uropathy: Secondary | ICD-10-CM | POA: Diagnosis not present

## 2023-08-05 DIAGNOSIS — R351 Nocturia: Secondary | ICD-10-CM | POA: Diagnosis not present

## 2023-08-05 DIAGNOSIS — R350 Frequency of micturition: Secondary | ICD-10-CM | POA: Diagnosis not present

## 2023-08-05 DIAGNOSIS — N401 Enlarged prostate with lower urinary tract symptoms: Secondary | ICD-10-CM | POA: Diagnosis not present

## 2023-08-05 DIAGNOSIS — R3915 Urgency of urination: Secondary | ICD-10-CM | POA: Diagnosis not present

## 2023-08-05 DIAGNOSIS — R310 Gross hematuria: Secondary | ICD-10-CM | POA: Diagnosis not present

## 2023-08-05 DIAGNOSIS — N35919 Unspecified urethral stricture, male, unspecified site: Secondary | ICD-10-CM | POA: Diagnosis not present

## 2023-08-05 DIAGNOSIS — I781 Nevus, non-neoplastic: Secondary | ICD-10-CM | POA: Diagnosis not present

## 2023-08-05 DIAGNOSIS — I10 Essential (primary) hypertension: Secondary | ICD-10-CM

## 2023-08-05 DIAGNOSIS — R519 Headache, unspecified: Secondary | ICD-10-CM | POA: Insufficient documentation

## 2023-08-05 DIAGNOSIS — Z87440 Personal history of urinary (tract) infections: Secondary | ICD-10-CM | POA: Diagnosis not present

## 2023-08-05 DIAGNOSIS — N35912 Unspecified bulbous urethral stricture, male: Secondary | ICD-10-CM | POA: Insufficient documentation

## 2023-08-05 DIAGNOSIS — N3031 Trigonitis with hematuria: Secondary | ICD-10-CM | POA: Diagnosis not present

## 2023-08-05 DIAGNOSIS — D494 Neoplasm of unspecified behavior of bladder: Secondary | ICD-10-CM | POA: Diagnosis not present

## 2023-08-05 DIAGNOSIS — N35812 Other urethral bulbous stricture, male: Secondary | ICD-10-CM | POA: Diagnosis not present

## 2023-08-05 HISTORY — PX: TRANSURETHRAL RESECTION OF BLADDER TUMOR: SHX2575

## 2023-08-05 SURGERY — TURBT (TRANSURETHRAL RESECTION OF BLADDER TUMOR)
Anesthesia: General

## 2023-08-05 MED ORDER — PHENAZOPYRIDINE HCL 200 MG PO TABS: 200.0000 mg | ORAL_TABLET | Freq: Three times a day (TID) | ORAL | 0 refills | Status: DC | PRN

## 2023-08-05 MED ORDER — MIDAZOLAM HCL 2 MG/2ML IJ SOLN
INTRAMUSCULAR | Status: AC
Start: 2023-08-05 — End: ?
  Filled 2023-08-05: qty 2

## 2023-08-05 MED ORDER — POLYETHYLENE GLYCOL 3350 17 G PO PACK: 17.0000 g | PACK | Freq: Every day | ORAL | 0 refills | Status: DC

## 2023-08-05 MED ORDER — SODIUM CHLORIDE 0.9 % IV SOLN
1.0000 g | INTRAVENOUS | Status: AC
  Administered 2023-08-05: 1 g via INTRAVENOUS
  Filled 2023-08-05: qty 1000

## 2023-08-05 MED ORDER — SODIUM CHLORIDE 0.9 % IR SOLN
Status: DC | PRN
  Administered 2023-08-05: 3000 mL via INTRAVESICAL

## 2023-08-05 MED ORDER — SUGAMMADEX SODIUM 200 MG/2ML IV SOLN
INTRAVENOUS | Status: DC | PRN
  Administered 2023-08-05: 400 mg via INTRAVENOUS

## 2023-08-05 MED ORDER — CHLORHEXIDINE GLUCONATE 0.12 % MT SOLN
15.0000 mL | Freq: Once | OROMUCOSAL | Status: AC
  Administered 2023-08-05: 15 mL via OROMUCOSAL

## 2023-08-05 MED ORDER — DEXAMETHASONE SODIUM PHOSPHATE 10 MG/ML IJ SOLN
INTRAMUSCULAR | Status: DC | PRN
  Administered 2023-08-05: 10 mg via INTRAVENOUS

## 2023-08-05 MED ORDER — LIDOCAINE HCL (PF) 2 % IJ SOLN
INTRAMUSCULAR | Status: AC
  Filled 2023-08-05: qty 5

## 2023-08-05 MED ORDER — ONDANSETRON HCL 4 MG/2ML IJ SOLN
INTRAMUSCULAR | Status: DC | PRN
  Administered 2023-08-05: 4 mg via INTRAVENOUS

## 2023-08-05 MED ORDER — FENTANYL CITRATE PF 50 MCG/ML IJ SOSY: 25.0000 ug | PREFILLED_SYRINGE | INTRAMUSCULAR | Status: DC | PRN

## 2023-08-05 MED ORDER — FENTANYL CITRATE (PF) 100 MCG/2ML IJ SOLN
INTRAMUSCULAR | Status: DC | PRN
  Administered 2023-08-05: 100 ug via INTRAVENOUS

## 2023-08-05 MED ORDER — LACTATED RINGERS IV SOLN: INTRAVENOUS | Status: DC

## 2023-08-05 MED ORDER — PROPOFOL 10 MG/ML IV BOLUS
INTRAVENOUS | Status: DC | PRN
  Administered 2023-08-05: 175 mg via INTRAVENOUS

## 2023-08-05 MED ORDER — ACETAMINOPHEN 10 MG/ML IV SOLN
1000.0000 mg | Freq: Once | INTRAVENOUS | Status: DC | PRN
Start: 2023-08-05 — End: 2023-08-05

## 2023-08-05 MED ORDER — FENTANYL CITRATE (PF) 100 MCG/2ML IJ SOLN
INTRAMUSCULAR | Status: AC
  Filled 2023-08-05: qty 2

## 2023-08-05 MED ORDER — MIDAZOLAM HCL 5 MG/5ML IJ SOLN
INTRAMUSCULAR | Status: DC | PRN
  Administered 2023-08-05: 2 mg via INTRAVENOUS

## 2023-08-05 MED ORDER — PROPOFOL 10 MG/ML IV BOLUS
INTRAVENOUS | Status: AC
  Filled 2023-08-05: qty 20

## 2023-08-05 MED ORDER — DEXAMETHASONE SODIUM PHOSPHATE 10 MG/ML IJ SOLN
INTRAMUSCULAR | Status: AC
  Filled 2023-08-05: qty 1

## 2023-08-05 MED ORDER — LIDOCAINE 2% (20 MG/ML) 5 ML SYRINGE
INTRAMUSCULAR | Status: DC | PRN
  Administered 2023-08-05: 60 mg via INTRAVENOUS

## 2023-08-05 MED ORDER — ROCURONIUM BROMIDE 10 MG/ML (PF) SYRINGE
PREFILLED_SYRINGE | INTRAVENOUS | Status: AC
  Filled 2023-08-05: qty 10

## 2023-08-05 MED ORDER — 0.9 % SODIUM CHLORIDE (POUR BTL) OPTIME
TOPICAL | Status: DC | PRN
  Administered 2023-08-05: 1000 mL

## 2023-08-05 MED ORDER — ORAL CARE MOUTH RINSE: 15.0000 mL | Freq: Once | OROMUCOSAL | Status: AC

## 2023-08-05 MED ORDER — HYOSCYAMINE SULFATE 0.125 MG PO TBDP: 0.1250 mg | ORAL_TABLET | Freq: Four times a day (QID) | ORAL | 0 refills | Status: DC | PRN

## 2023-08-05 MED ORDER — ACETAMINOPHEN 500 MG PO TABS
1000.0000 mg | ORAL_TABLET | Freq: Once | ORAL | Status: AC
Start: 2023-08-05 — End: 2023-08-05
  Administered 2023-08-05: 1000 mg via ORAL
  Filled 2023-08-05: qty 2

## 2023-08-05 MED ORDER — ONDANSETRON HCL 4 MG/2ML IJ SOLN
INTRAMUSCULAR | Status: AC
  Filled 2023-08-05: qty 2

## 2023-08-05 MED ORDER — ROCURONIUM BROMIDE 10 MG/ML (PF) SYRINGE
PREFILLED_SYRINGE | INTRAVENOUS | Status: DC | PRN
  Administered 2023-08-05: 60 mg via INTRAVENOUS

## 2023-08-05 SURGICAL SUPPLY — 15 items
BAG URO CATCHER STRL LF (MISCELLANEOUS) ×1 IMPLANT
CATH FOLEY 2WAY SLVR 5CC 16FR (CATHETERS) IMPLANT
DRAPE FOOT SWITCH (DRAPES) ×1 IMPLANT
ELECT REM PT RETURN 15FT ADLT (MISCELLANEOUS) ×1 IMPLANT
GLOVE BIO SURGEON STRL SZ8 (GLOVE) ×1 IMPLANT
GOWN STRL REUS W/ TWL XL LVL3 (GOWN DISPOSABLE) ×1 IMPLANT
KIT TURNOVER KIT A (KITS) IMPLANT
LOOP CUT BIPOLAR 24F LRG (ELECTROSURGICAL) IMPLANT
MANIFOLD NEPTUNE II (INSTRUMENTS) ×1 IMPLANT
NDL SAFETY ECLIPSE 18X1.5 (NEEDLE) ×1 IMPLANT
PACK CYSTO (CUSTOM PROCEDURE TRAY) ×1 IMPLANT
SYR TOOMEY IRRIG 70ML (MISCELLANEOUS)
SYRINGE TOOMEY IRRIG 70ML (MISCELLANEOUS) IMPLANT
TUBING CONNECTING 10 (TUBING) ×1 IMPLANT
TUBING UROLOGY SET (TUBING) ×1 IMPLANT

## 2023-08-05 NOTE — Op Note (Signed)
 Operative Note  Preoperative diagnosis:  1.  Erythematous lesion bladder   Postoperative diagnosis: 1.  Erythematous lesion bladder  2.  18 French bulbar urethral stricture  Procedure(s): 1.  Small TURBT 1.5 cm   Surgeon: Shane Sensing MD  Assistants:  None  Anesthesia:  General  Complications:  None  EBL:  minimal  Specimens: 1.  Bladder biopsy  Drains/Catheters: 1.  16 French urethral catheter 10 cc in the balloon  Intraoperative findings:   18 French bulbar urethral stricture dilated with the scope Erythematous lesion left bladder neck. Bilateral ureteral orifices in orthotopic position. Number of tumors:          1 erythematous lesion  Size of largest tumor:     1.5 cm  Characteristics of tumors:         Flat          Primary     Suspicious for Carcinoma in situ:    flat and erythematous    Bimanual exam under anesthesia:        No  Visually complete resection:       YEs  Visualization of detrusor muscle in resection base:        YEs  Visual evaluation for perforation:         No    Indication:  Jose Blanck. is a 68 y.o. male with history of gross hematuria in 2021 never had workup cystoscopy demonstrated erythematous lesion in the anterior bladder he is here today for biopsy of that lesion.  All the risks, benefits were discussed with the patient to include but not limited to infection, pain, bleeding, damage to adjacent structures, need for further operations, adverse reaction to anesthesia and death.  Patient understands these risks and agrees to proceed with the operation as planned.    Description of procedure: After informed consent was obtained from the patient, the patient was taken to the operating room. General anesthesia was administered. The patient was placed in dorsal lithotomy position and prepped and draped in usual sterile fashion. Sequential compression devices were applied to lower extremities at the beginning of the case for DVT  prophylaxis. Antibiotics were infused prior to surgery start time. A surgical time-out was performed to properly identify the patient, the surgery to be performed, and the surgical site.     We then passed the 21-French rigid cystoscope down the urethra and into the bladder under direct vision without any difficulty.  The bulbar urethra was noted to have a small 18 French stricture that was easily navigated by the 2 21 French cystoscope.  The prostate was non-obstructing. The bladder was inspected with 30 and 70 degree lenses. Once in the bladder, systematic evaluation of bladder revealed ureteral orifices in orthotopic position and erythematous later at the anterior left bladder neck.   We then removed the cystoscope and then passed down the 26 French resectoscope sheath down the urethra into the bladder under direct vision with the visual obturator. The tumor was resected down to muscle. The TUR bladder tumor chips were retrieved from the bladder and each region of resection was passed off the field as a separate specimen.  Hemostasis was achieved using electrocautery. We then proceeded with removing the resectoscope, of note the stricture And dilated noted some minor ventral urethral injury.  Because of this we then placed in a 16 fr Foley catheter. The patient tolerated the procedure well with no complication and was awoken from anesthesia and taken to recovery in stable  condition.     Plan: Patient to be discharged home with catheter, this will be removed on 08/09/2023.  Pathology review on 08/16/2023.  Jose Shea  [facility]

## 2023-08-05 NOTE — Anesthesia Postprocedure Evaluation (Signed)
 Anesthesia Post Note  Patient: Jose Shea.  Procedure(s) Performed: TRANSURETHRAL RESECTION OF BLADDER TUMOR (TURBT)     Patient location during evaluation: PACU Anesthesia Type: General Level of consciousness: awake and alert Pain management: pain level controlled Vital Signs Assessment: post-procedure vital signs reviewed and stable Respiratory status: spontaneous breathing, nonlabored ventilation, respiratory function stable and patient connected to nasal cannula oxygen Cardiovascular status: blood pressure returned to baseline and stable Postop Assessment: no apparent nausea or vomiting Anesthetic complications: no   No notable events documented.  Last Vitals:  Vitals:   08/05/23 0900 08/05/23 0915  BP: 136/84 126/86  Pulse: (!) 58 (!) 58  Resp: 14   Temp:  36.6 C  SpO2: 95% 95%    Last Pain:  Vitals:   08/05/23 0915  TempSrc:   PainSc: 0-No pain                 Cordella P Najeh Credit

## 2023-08-05 NOTE — Transfer of Care (Signed)
 Immediate Anesthesia Transfer of Care Note  Patient: Jose Shea.  Procedure(s) Performed: TRANSURETHRAL RESECTION OF BLADDER TUMOR (TURBT)  Patient Location: PACU  Anesthesia Type:General  Level of Consciousness: sedated  Airway & Oxygen Therapy: Patient Spontanous Breathing and Patient connected to face mask oxygen  Post-op Assessment: Report given to RN and Post -op Vital signs reviewed and stable  Post vital signs: Reviewed and stable  Last Vitals:  Vitals Value Taken Time  BP 137/91 08/05/23 0825  Temp    Pulse 75 08/05/23 0826  Resp 9 08/05/23 0826  SpO2 96 % 08/05/23 0826  Vitals shown include unfiled device data.  Last Pain:  Vitals:   08/05/23 0635  TempSrc:   PainSc: 0-No pain      Patients Stated Pain Goal: 4 (08/05/23 9364)  Complications: No notable events documented.

## 2023-08-05 NOTE — Discharge Instructions (Signed)
 Transurethral Resection of Bladder Tumor (TURBT) or Bladder Biopsy   Definition:  Transurethral Resection of the Bladder Tumor is a surgical procedure used to diagnose and remove tumors within the bladder. TURBT is the most common treatment for early stage bladder cancer.  General instructions:     Your recent bladder surgery requires very little post hospital care but some definite precautions.  Despite the fact that no skin incisions were used, the area around the bladder incisions are raw and covered with scabs to promote healing and prevent bleeding. Certain precautions are needed to insure that the scabs are not disturbed over the next 2-4 weeks while the healing proceeds.  Because the raw surface inside your bladder and the irritating effects of urine you may expect frequency of urination and/or urgency (a stronger desire to urinate) and perhaps even getting up at night more often. This will usually resolve or improve slowly over the healing period. You may see some blood in your urine over the first 6 weeks. Do not be alarmed, even if the urine was clear for a while. Get off your feet and drink lots of fluids until clearing occurs. If you start to pass clots or don't improve call us.  Catheter: Due to the nature of this surgery some patients need to be discharged with a catheter. If you are discharged with a catheter instructions on how to care for the catheter will be attached to your discharge paperwork. You will be called to schedule an appointment to remove the catheter.   Diet:  You may return to your normal diet immediately. Because of the raw surface of your bladder, alcohol, spicy foods, foods high in acid and drinks with caffeine may cause irritation or frequency and should be used in moderation. To keep your urine flowing freely and avoid constipation, drink plenty of fluids during the day (8-10 glasses). Tip: Avoid cranberry juice because it is very acidic.  Activity:  Do not  lift more than 10 LBS for 2 weeks Do not strain with bowel movents.  Your physical activity doesn't need to be restricted. However, if you are very active, you may see some blood in the urine. We suggest that you reduce your activity under the circumstances until the bleeding has stopped.  Bowels:  It is important to keep your bowels regular during the postoperative period. Straining with bowel movements can cause bleeding. A bowel movement every other day is reasonable. Use a mild laxative if needed, such as milk of magnesia 2-3 tablespoons, or 2 Dulcolax tablets. Call if you continue to have problems. If you had been taking narcotics for pain, before, during or after your surgery, you may be constipated. Take a laxative if necessary.    Medication:  You should resume your pre-surgery medications unless told not to. In addition you may be given an antibiotic to prevent or treat infection. Antibiotics are not always necessary. All medication should be taken as prescribed until the bottles are finished unless you are having an unusual reaction to one of the drugs.

## 2023-08-05 NOTE — Anesthesia Procedure Notes (Signed)
 Procedure Name: Intubation Date/Time: 08/05/2023 7:36 AM  Performed by: Carleton Garnette SAUNDERS, CRNAPre-anesthesia Checklist: Patient identified, Emergency Drugs available, Suction available, Patient being monitored and Timeout performed Patient Re-evaluated:Patient Re-evaluated prior to induction Oxygen Delivery Method: Circle system utilized Preoxygenation: Pre-oxygenation with 100% oxygen Induction Type: IV induction Ventilation: Mask ventilation without difficulty Laryngoscope Size: Mac and 4 Grade View: Grade II Tube type: Oral Tube size: 7.5 mm Number of attempts: 1 Airway Equipment and Method: Stylet Placement Confirmation: ETT inserted through vocal cords under direct vision, positive ETCO2 and breath sounds checked- equal and bilateral Secured at: 22 cm Tube secured with: Tape Dental Injury: Teeth and Oropharynx as per pre-operative assessment

## 2023-08-06 ENCOUNTER — Encounter (HOSPITAL_COMMUNITY): Payer: Self-pay | Admitting: Urology

## 2023-08-06 LAB — SURGICAL PATHOLOGY

## 2023-08-09 DIAGNOSIS — D414 Neoplasm of uncertain behavior of bladder: Secondary | ICD-10-CM | POA: Diagnosis not present

## 2023-08-09 DIAGNOSIS — R310 Gross hematuria: Secondary | ICD-10-CM | POA: Diagnosis not present

## 2023-08-09 DIAGNOSIS — N35011 Post-traumatic bulbous urethral stricture: Secondary | ICD-10-CM | POA: Diagnosis not present

## 2023-08-16 DIAGNOSIS — N3000 Acute cystitis without hematuria: Secondary | ICD-10-CM | POA: Diagnosis not present

## 2023-08-16 DIAGNOSIS — R310 Gross hematuria: Secondary | ICD-10-CM | POA: Diagnosis not present

## 2023-08-16 DIAGNOSIS — N35011 Post-traumatic bulbous urethral stricture: Secondary | ICD-10-CM | POA: Diagnosis not present

## 2023-08-21 ENCOUNTER — Other Ambulatory Visit: Payer: Self-pay | Admitting: Student

## 2023-08-21 DIAGNOSIS — E782 Mixed hyperlipidemia: Secondary | ICD-10-CM

## 2023-08-28 ENCOUNTER — Other Ambulatory Visit: Payer: Self-pay | Admitting: Student

## 2023-08-28 DIAGNOSIS — G43819 Other migraine, intractable, without status migrainosus: Secondary | ICD-10-CM

## 2023-09-06 ENCOUNTER — Other Ambulatory Visit: Payer: Self-pay | Admitting: Student

## 2023-09-25 ENCOUNTER — Other Ambulatory Visit: Payer: Self-pay | Admitting: Student

## 2023-09-25 DIAGNOSIS — R39198 Other difficulties with micturition: Secondary | ICD-10-CM

## 2023-10-31 ENCOUNTER — Other Ambulatory Visit: Payer: Self-pay | Admitting: Student

## 2023-11-23 DIAGNOSIS — Z860100 Personal history of colon polyps, unspecified: Secondary | ICD-10-CM | POA: Diagnosis not present

## 2023-11-23 DIAGNOSIS — Z09 Encounter for follow-up examination after completed treatment for conditions other than malignant neoplasm: Secondary | ICD-10-CM | POA: Diagnosis not present

## 2023-11-23 LAB — HM COLONOSCOPY

## 2023-12-14 ENCOUNTER — Other Ambulatory Visit: Payer: Self-pay | Admitting: Student

## 2023-12-14 DIAGNOSIS — R39198 Other difficulties with micturition: Secondary | ICD-10-CM

## 2023-12-25 ENCOUNTER — Other Ambulatory Visit: Payer: Self-pay | Admitting: Student

## 2023-12-27 ENCOUNTER — Other Ambulatory Visit: Payer: Self-pay

## 2023-12-27 DIAGNOSIS — G43819 Other migraine, intractable, without status migrainosus: Secondary | ICD-10-CM

## 2023-12-27 MED ORDER — SUMATRIPTAN SUCCINATE 100 MG PO TABS
100.0000 mg | ORAL_TABLET | ORAL | 3 refills | Status: AC | PRN
Start: 2023-12-27 — End: ?

## 2024-01-04 ENCOUNTER — Encounter: Payer: Self-pay | Admitting: *Deleted

## 2024-02-10 ENCOUNTER — Other Ambulatory Visit: Payer: Self-pay | Admitting: Family Medicine

## 2024-02-18 ENCOUNTER — Other Ambulatory Visit: Payer: Self-pay

## 2024-02-18 DIAGNOSIS — G43819 Other migraine, intractable, without status migrainosus: Secondary | ICD-10-CM

## 2024-02-22 ENCOUNTER — Other Ambulatory Visit: Payer: Self-pay

## 2024-02-22 DIAGNOSIS — G43819 Other migraine, intractable, without status migrainosus: Secondary | ICD-10-CM

## 2024-02-22 MED ORDER — CYCLOBENZAPRINE HCL 10 MG PO TABS: 10.0000 mg | ORAL_TABLET | Freq: Every evening | ORAL | 1 refills | Status: DC | PRN

## 2024-02-22 MED ORDER — PROPRANOLOL HCL ER 80 MG PO CP24: 160.0000 mg | ORAL_CAPSULE | Freq: Every day | ORAL | 3 refills | Status: AC

## 2024-02-22 NOTE — Telephone Encounter (Signed)
 Patient calls nurse line requesting refills on propranolol  and cyclobenzaprine .   He has used all available refills at the pharmacy.   Requesting [facility] [address]   Forwarding to PCP.   Chiquita JAYSON English, RN

## 2024-03-09 ENCOUNTER — Ambulatory Visit (INDEPENDENT_AMBULATORY_CARE_PROVIDER_SITE_OTHER): Payer: Medicare Other

## 2024-03-09 VITALS — Ht 68.0 in | Wt 165.0 lb

## 2024-03-09 DIAGNOSIS — Z0000 Encounter for general adult medical examination without abnormal findings: Secondary | ICD-10-CM

## 2024-03-09 NOTE — Patient Instructions (Signed)
 [name] , Thank you for taking time out of your busy schedule to complete your Annual Wellness Visit with me. I enjoyed our conversation and look forward to speaking with you again next year. I, as well as your care team,  appreciate your ongoing commitment to your health goals. Please review the following plan we discussed and let me know if I can assist you in the future. Your Game plan/ To Do List    Referrals: If you haven't heard from the office you've been referred to, please reach out to them at the phone provided.   Follow up Visits: We will see or speak with you next year for your Next Medicare AWV with our clinical staff Have you seen your provider in the last 6 months (3 months if uncontrolled diabetes)? No  Clinician Recommendations:  Aim for 30 minutes of exercise or brisk walking, 6-8 glasses of water, and 5 servings of fruits and vegetables each day.       This is a list of the screenings recommended for you:  Health Maintenance  Topic Date Due   COVID-19 Vaccine (8 - 2024-25 season) 10/18/2023   Flu Shot  02/25/2024   DTaP/Tdap/Td vaccine (4 - Td or Tdap) 05/27/2024   Medicare Annual Wellness Visit  03/09/2025   Colon Cancer Screening  11/22/2033   Pneumococcal Vaccine for age over 22  Completed   Hepatitis C Screening  Completed   HIV Screening  Completed   Zoster (Shingles) Vaccine  Completed   Hepatitis B Vaccine  Aged Out   HPV Vaccine  Aged Out   Meningitis B Vaccine  Aged Out    Advanced directives: (Copy Requested) Please bring a copy of your health care power of attorney and living will to the office to be added to your chart at your convenience. You can mail to [facility] [address] [place], [place] [address] or email to [email or web address] Advance Care Planning is important because it:  [x]  Makes sure you receive the medical care that is consistent with your values, goals, and preferences  [x]  It provides guidance to your  family and loved ones and reduces their decisional burden about whether or not they are making the right decisions based on your wishes.  Follow the link provided in your after visit summary or read over the paperwork we have mailed to you to help you started getting your Advance Directives in place. If you need assistance in completing these, please reach out to us  so that we can help you!  See attachments for Preventive Care and Fall Prevention Tips.

## 2024-03-09 NOTE — Progress Notes (Signed)
 Because this visit was a virtual/telehealth visit,  certain criteria was not obtained, such a blood pressure, CBG if applicable, and timed get up and go. Any medications not marked as taking were not mentioned during the medication reconciliation part of the visit. Any vitals not documented were not able to be obtained due to this being a telehealth visit or patient was unable to self-report a recent blood pressure reading due to a lack of equipment at home via telehealth. Vitals that have been documented are verbally provided by the patient.   Subjective:   Jose Shea. is a 68 y.o. who presents for a Medicare Wellness preventive visit.  As a reminder, Annual Wellness Visits don't include a physical exam, and some assessments may be limited, especially if this visit is performed virtually. We may recommend an in-person follow-up visit with your provider if needed.  Visit Complete: Virtual I connected with  Deward FORBES Carrington Mickey. on 03/09/24 by a audio enabled telemedicine application and verified that I am speaking with the correct person using two identifiers.  Patient Location: Home  Provider Location: Home Office  I discussed the limitations of evaluation and management by telemedicine. The patient expressed understanding and agreed to proceed.  Vital Signs: Because this visit was a virtual/telehealth visit, some criteria may be missing or patient reported. Any vitals not documented were not able to be obtained and vitals that have been documented are patient reported.  VideoDeclined- This patient declined Librarian, academic. Therefore the visit was completed with audio only.  Persons Participating in Visit: Patient.  AWV Questionnaire: Yes: Patient Medicare AWV questionnaire was completed by the patient on 03/06/2024; I have confirmed that all information answered by patient is correct and no changes since this date.  Cardiac Risk Factors include: advanced  age (>5men, >7 women);hypertension;male gender     Objective:    Today's Vitals   03/09/24 0914  Weight: 165 lb (74.8 kg)  Height: 5' 8 (1.727 m)  PainSc: 3   PainLoc: Neck   Body mass index is 25.09 kg/m.     03/09/2024    9:17 AM 08/05/2023    6:37 AM 07/26/2023   10:11 AM 05/21/2023    8:51 AM 04/20/2023   11:13 AM 03/31/2023   10:28 AM 03/08/2023    1:28 PM  Advanced Directives  Does Patient Have a Medical Advance Directive? Yes Yes Yes Yes Yes Yes No  Type of Estate agent of [place];Living will Healthcare Power of [place];Living will Healthcare Power of [place];Living will Living will Living will Healthcare Power of Attorney   Does patient want to make changes to medical advance directive?  No - Patient declined  No - Guardian declined No - Guardian declined    Copy of Healthcare Power of Attorney in Chart? No - copy requested No - copy requested No - copy requested Yes - validated most recent copy scanned in chart (See row information) Yes - validated most recent copy scanned in chart (See row information) Yes - validated most recent copy scanned in chart (See row information)   Would patient like information on creating a medical advance directive?    No - Patient declined No - Patient declined No - Patient declined Yes (MAU/Ambulatory/Procedural Areas - Information given)    Current Medications (verified) Outpatient Encounter Medications as of 03/09/2024  Medication Sig   aspirin EC 81 MG tablet Take 81 mg by mouth daily. Swallow whole.   atorvastatin  (LIPITOR) 20  MG tablet TAKE 1 TABLET BY MOUTH EVERY DAY   cetirizine (ZYRTEC) 10 MG tablet Take 10 mg by mouth daily.   cyclobenzaprine  (FLEXERIL ) 10 MG tablet Take 1 tablet (10 mg total) by mouth at bedtime as needed for muscle spasms.   esomeprazole  (NEXIUM ) 20 MG capsule Take 1 capsule (20 mg total) by mouth daily at 12 noon.   finasteride (PROSCAR) 5 MG tablet Take 5 mg by mouth daily.    fluticasone  (FLONASE ) 50 MCG/ACT nasal spray USE 2 SPRAYS INTO EACH NOSTRIL DAILY   lisinopril  (ZESTRIL ) 20 MG tablet TAKE 1 TABLET BY MOUTH EVERY DAY   Magnesium  Oxide 250 MG TABS Take 250 mg by mouth daily.   Multiple Vitamin (MULTIVITAMIN WITH MINERALS) TABS tablet Take 1 tablet by mouth daily.   oxymetazoline (AFRIN) 0.05 % nasal spray Place 1-2 sprays into both nostrils at bedtime as needed for congestion.   phenazopyridine  (PYRIDIUM ) 200 MG tablet Take 1 tablet (200 mg total) by mouth 3 (three) times daily as needed for up to 6 doses.   propranolol  ER (INDERAL  LA) 80 MG 24 hr capsule Take 2 capsules (160 mg total) by mouth at bedtime.   pyridOXINE (VITAMIN B6) 100 MG tablet Take 100 mg by mouth daily as needed (neck/back pain).   rOPINIRole  (REQUIP ) 0.25 MG tablet TAKE 1 TABLET BY MOUTH 1-2 HOURS BEFORE BEDTIME FOR RESTLESS LEG SYNDROME AS NEEDED   SUMAtriptan  (IMITREX ) 100 MG tablet Take 1 tablet (100 mg total) by mouth every 2 (two) hours as needed. Max 2x a day.   SUMAtriptan  (IMITREX ) 50 MG tablet Take 50 mg by mouth every 2 (two) hours as needed for migraine. May repeat in 2 hours if headache persists or recurs.   tamsulosin  (FLOMAX ) 0.4 MG CAPS capsule TAKE 2 CAPSULES BY MOUTH EVERY DAY   tetrahydrozoline 0.05 % ophthalmic solution Place 1 drop into both eyes daily as needed (dry eye).   [DISCONTINUED] hyoscyamine  (ANASPAZ ) 0.125 MG TBDP disintergrating tablet Place 1 tablet (0.125 mg total) under the tongue every 6 (six) hours as needed for up to 8 doses for bladder spasms (DO not use 24 hours prior to catheter removal).   [DISCONTINUED] polyethylene glycol (MIRALAX ) 17 g packet Take 17 g by mouth daily.   No facility-administered encounter medications on file as of 03/09/2024.    Allergies (verified) Fluoxetine hcl, Quinine, and Ham flavoring agent (non-screening)   History: Past Medical History:  Diagnosis Date   Arthritis    Blood dyscrasia    Thrombocytopenia from taking  a  medication   Chest pain    Chronic headaches    Gastric ulcer    GERD (gastroesophageal reflux disease)    Gross hematuria    History of kidney stones    Hypertension    Lumbar back pain    Migraine    Pneumonia    PONV (postoperative nausea and vomiting)    Restless leg    Voiding difficulty    Weak urinary stream    Past Surgical History:  Procedure Laterality Date   CERVICAL FUSION     COLONOSCOPY     HERNIA REPAIR     LUMBAR LAMINECTOMY/DECOMPRESSION MICRODISCECTOMY N/A 12/18/2015   Procedure: LUMBAR 4-5 DECOMPRESSION;  Surgeon: Oneil Priestly, MD;  Location: [facility] OR;  Service: Orthopedics;  Laterality: N/A;  LUMBAR 4-5 DECOMPRESSION   NASAL SINUS SURGERY  07/28/2003   TRANSURETHRAL RESECTION OF BLADDER TUMOR N/A 08/05/2023   Procedure: TRANSURETHRAL RESECTION OF BLADDER TUMOR (TURBT);  Surgeon: Shane Sensing  C, MD;  Location: [facility];  Service: Urology;  Laterality: N/A;  60 minute case   Family History  Problem Relation Age of Onset   Migraines Paternal Uncle    Social History   Socioeconomic History   Marital status: Single    Spouse name: Not on file   Number of children: Not on file   Years of education: Not on file   Highest education level: Bachelor's degree (e.g., BA, AB, BS)  Occupational History   Not on file  Tobacco Use   Smoking status: Never    Passive exposure: Past   Smokeless tobacco: Never  Vaping Use   Vaping status: Never Used  Substance and Sexual Activity   Alcohol use: Yes    Alcohol/week: 7.0 standard drinks of alcohol    Types: 5 Cans of beer, 2 Shots of liquor per week    Comment: one drink a day   Drug use: No    Comment: CBD gummy occasionally for sleep   Sexual activity: Not on file  Other Topics Concern   Not on file  Social History Narrative   Not on file   Social Drivers of Health   Financial Resource Strain: Low Risk  (03/09/2024)   Overall Financial Resource Strain (CARDIA)    Difficulty of Paying Living Expenses: Not  hard at all  Food Insecurity: No Food Insecurity (03/09/2024)   Hunger Vital Sign    Worried About Running Out of Food in the Last Year: Never true    Ran Out of Food in the Last Year: Never true  Transportation Needs: No Transportation Needs (03/09/2024)   PRAPARE - Administrator, Civil Service (Medical): No    Lack of Transportation (Non-Medical): No  Physical Activity: Sufficiently Active (03/09/2024)   Exercise Vital Sign    Days of Exercise per Week: 3 days    Minutes of Exercise per Session: 60 min  Stress: No Stress Concern Present (03/09/2024)   [employer] of Occupational Health - Occupational Stress Questionnaire    Feeling of Stress: Only a little  Social Connections: Moderately Isolated (03/09/2024)   Social Connection and Isolation Panel    Frequency of Communication with Friends and Family: More than three times a week    Frequency of Social Gatherings with Friends and Family: Once a week    Attends Religious Services: More than 4 times per year    Active Member of [employer] or Organizations: No    Attends Engineer, structural: Never    Marital Status: Never married    Tobacco Counseling Counseling given: Not Answered    Clinical Intake:  Pre-visit preparation completed: Yes  Pain : No/denies pain Pain Score: 3      BMI - recorded: 25.09 Nutritional Status: BMI 25 -29 Overweight Nutritional Risks: None Diabetes: No  Lab Results  Component Value Date   HGBA1C 5.6 09/10/2022     How often do you need to have someone help you when you read instructions, pamphlets, or other written materials from your doctor or pharmacy?: 1 - Never  Interpreter Needed?: No  Information entered by :: Emmaleigh Longo N. Neima Lacross, LPN.   Activities of Daily Living     03/06/2024    8:17 AM 07/26/2023   10:12 AM  In your present state of health, do you have any difficulty performing the following activities:  Hearing? 0   Vision? 0   Difficulty  concentrating or making decisions? 0   Walking or climbing stairs?  0   Dressing or bathing? 0   Doing errands, shopping? 0 0  Preparing Food and eating ? N   Using the Toilet? N   In the past six months, have you accidently leaked urine? Y   Do you have problems with loss of bowel control? N   Managing your Medications? N   Managing your Finances? N   Housekeeping or managing your Housekeeping? N     Patient Care Team: Janna Ferrier, DO as PCP - General (Family Medicine) Leila Bound, OD (Optometry) Dianna Specking, MD as Consulting Physician (Gastroenterology) Shane Steffan BROCKS, MD as Consulting Physician (Urology)  I have updated your Care Teams any recent Medical Services you may have received from other providers in the past year.     Assessment:   This is a routine wellness examination for [place].  Hearing/Vision screen Hearing Screening - Comments:: Denies hearing difficulties.  Vision Screening - Comments:: Wears reading glasses - up to date with routine eye exams with [facility] on [address].    Goals Addressed             This Visit's Progress    03/09/2024: To maintain my health, stay independent and active.         Depression Screen     03/09/2024    9:18 AM 05/21/2023    8:51 AM 04/20/2023   11:14 AM 03/31/2023   11:05 AM 03/08/2023    1:26 PM 11/23/2022   10:26 AM 09/10/2022    2:04 PM  PHQ 2/9 Scores  PHQ - 2 Score 0 2 0 0 0 0 0  PHQ- 9 Score 1 5 3 4  4 2     Fall Risk     03/09/2024    9:18 AM 03/06/2024    8:17 AM 05/21/2023    9:01 AM 03/31/2023    9:49 AM 03/04/2023    8:50 AM  Fall Risk   Falls in the past year? 0 0 0 0 0  Number falls in past yr: 0  0 0 0  Injury with Fall? 0  0  0  Risk for fall due to : No Fall Risks      Follow up Falls evaluation completed        MEDICARE RISK AT HOME:  Medicare Risk at Home Any stairs in or around the home?: No If so, are there any without handrails?: No Home free of loose throw  rugs in walkways, pet beds, electrical cords, etc?: Yes Adequate lighting in your home to reduce risk of falls?: Yes Life alert?: No Use of a cane, walker or w/c?: No Grab bars in the bathroom?: Yes Shower chair or bench in shower?: No Elevated toilet seat or a handicapped toilet?: No  TIMED UP AND GO:  Was the test performed?  No  Cognitive Function: Declined/Normal: No cognitive concerns noted by patient or family. Patient alert, oriented, able to answer questions appropriately and recall recent events. No signs of memory loss or confusion.    03/09/2024    9:20 AM  MMSE - Mini Mental State Exam  Not completed: Unable to complete        03/09/2024    9:20 AM 03/08/2023   11:40 AM  6CIT Screen  What Year? 0 points 0 points  What month? 0 points 0 points  What time? 0 points 0 points  Count back from 20 0 points 0 points  Months in reverse 0 points 0 points  Repeat phrase 0  points 0 points  Total Score 0 points 0 points    Immunizations Immunization History  Administered Date(s) Administered   Fluad Trivalent(High Dose 65+) 03/31/2023   Influenza Inj Mdck Quad With Preservative 04/28/2021   Influenza Split 06/01/2011   Influenza Whole 04/20/2008, 06/12/2009, 05/20/2010   Influenza,inj,Quad PF,6+ Mos 04/28/2013, 04/23/2015, 03/26/2017, 05/22/2018, 03/23/2019   Influenza-Unspecified 04/26/2017, 03/23/2019, 05/10/2020   PFIZER Comirnaty(Gray Top)Covid-19 Tri-Sucrose Vaccine 12/25/2020   PFIZER(Purple Top)SARS-COV-2 Vaccination 10/12/2019, 11/06/2019, 06/08/2020   PNEUMOCOCCAL CONJUGATE-20 03/31/2023   Pfizer(Comirnaty)Fall Seasonal Vaccine 12 years and older 09/10/2022, 04/20/2023   Respiratory Syncytial Virus Vaccine,Recomb Aduvanted(Arexvy) 07/11/2022   Td 06/26/1996, 07/18/2007   Tdap 05/27/2014   Unspecified SARS-COV-2 Vaccination 04/28/2021   Zoster Recombinant(Shingrix) 09/06/2019, 11/28/2019    Screening Tests Health Maintenance  Topic Date Due   COVID-19  Vaccine (8 - 2024-25 season) 10/18/2023   INFLUENZA VACCINE  02/25/2024   DTaP/Tdap/Td (4 - Td or Tdap) 05/27/2024   Medicare Annual Wellness (AWV)  03/09/2025   Colonoscopy  11/22/2033   Pneumococcal Vaccine: 50+ Years  Completed   Hepatitis C Screening  Completed   HIV Screening  Completed   Zoster Vaccines- Shingrix  Completed   Hepatitis B Vaccines 19-59 Average Risk  Aged Out   HPV VACCINES  Aged Out   Meningococcal B Vaccine  Aged Out    Health Maintenance  Health Maintenance Due  Topic Date Due   COVID-19 Vaccine (8 - 2024-25 season) 10/18/2023   INFLUENZA VACCINE  02/25/2024   Health Maintenance Items Addressed:Yes Patient is due for Covid-19 and Flu vaccine in the Fall Season.  Additional Screening:  Vision Screening: Recommended annual ophthalmology exams for early detection of glaucoma and other disorders of the eye. Would you like a referral to an eye doctor? No    Dental Screening: Recommended annual dental exams for proper oral hygiene  Community Resource Referral / Chronic Care Management: CRR required this visit?  No   CCM required this visit?  No   Plan:    I have personally reviewed and noted the following in the patient's chart:   Medical and social history Use of alcohol, tobacco or illicit drugs  Current medications and supplements including opioid prescriptions. Patient is not currently taking opioid prescriptions. Functional ability and status Nutritional status Physical activity Advanced directives List of other physicians Hospitalizations, surgeries, and ER visits in previous 12 months Vitals Screenings to include cognitive, depression, and falls Referrals and appointments  In addition, I have reviewed and discussed with patient certain preventive protocols, quality metrics, and best practice recommendations. A written personalized care plan for preventive services as well as general preventive health recommendations were provided to  patient.   Roz LOISE Fuller, LPN   [id number]/[id number]   After Visit Summary: (MyChart) Due to this being a telephonic visit, the after visit summary with patients personalized plan was offered to patient via MyChart   Notes: Nothing significant to report at this time.

## 2024-03-15 ENCOUNTER — Ambulatory Visit (INDEPENDENT_AMBULATORY_CARE_PROVIDER_SITE_OTHER)

## 2024-03-15 VITALS — BP 134/84 | HR 59 | Ht 68.0 in | Wt 170.2 lb

## 2024-03-15 DIAGNOSIS — Z113 Encounter for screening for infections with a predominantly sexual mode of transmission: Secondary | ICD-10-CM

## 2024-03-15 DIAGNOSIS — G2581 Restless legs syndrome: Secondary | ICD-10-CM

## 2024-03-15 DIAGNOSIS — G4700 Insomnia, unspecified: Secondary | ICD-10-CM | POA: Diagnosis not present

## 2024-03-15 DIAGNOSIS — J310 Chronic rhinitis: Secondary | ICD-10-CM | POA: Diagnosis not present

## 2024-03-15 DIAGNOSIS — Z131 Encounter for screening for diabetes mellitus: Secondary | ICD-10-CM | POA: Diagnosis not present

## 2024-03-15 DIAGNOSIS — Z8711 Personal history of peptic ulcer disease: Secondary | ICD-10-CM | POA: Diagnosis not present

## 2024-03-15 DIAGNOSIS — T485X5A Adverse effect of other anti-common-cold drugs, initial encounter: Secondary | ICD-10-CM

## 2024-03-15 DIAGNOSIS — M5416 Radiculopathy, lumbar region: Secondary | ICD-10-CM

## 2024-03-15 LAB — POCT GLYCOSYLATED HEMOGLOBIN (HGB A1C): Hemoglobin A1C: 5.6 % (ref 4.0–5.6)

## 2024-03-15 MED ORDER — FAMOTIDINE 40 MG PO TABS: 40.0000 mg | ORAL_TABLET | Freq: Every day | ORAL | 2 refills | Status: DC

## 2024-03-15 MED ORDER — CYCLOBENZAPRINE HCL 10 MG PO TABS: 10.0000 mg | ORAL_TABLET | Freq: Every evening | ORAL | 2 refills | Status: DC | PRN

## 2024-03-15 NOTE — Assessment & Plan Note (Signed)
 Well-controlled, no bleeding or symptoms over the last several decades.  Reports that he had previously tried Pepcid  but was not sure of the dose and stated his reflux was a little worse. -Identify and reduce food triggers -Discontinue esomeprazole  -Start Pepcid  40 mg once a day

## 2024-03-15 NOTE — Progress Notes (Signed)
 SUBJECTIVE:   Chief compliant/HPI: annual examination  Jose Shea. is a 68 y.o. who presents today for an annual exam.   History tabs reviewed and updated.   Review of systems form reviewed and notable for sinus congestion in the AM and sleep difficulties.  Sinus congestion: ongoing issue, s/p sinus surgery 2005, chronic use of Afrin 2-4 sprays for about 20 years, history of addiction to nasal sprays in 1972-73 for about 15 years on vicks sinex menthol. Some eye pressure, no post nasal drip, some sneezing and coughing. Current allergy regiment is working well.  Sleep: Insomnia for his whole life, previously worked doing moonlight work 2023. Last 3 months has gotten worse, 5-6 hours of sleep, difficulty falling asleep and staying asleep. Tried Melatonin and valerium with no benefit. Peeing once at night, but wakes up 3-4 times a night. Does have stress regarding the sell of a home and the repairs he has to do on another house. Depressed mood and attributes it to his sleep and pain, no SI/HI, no anhedonia. RLS is well controlled with current regiment. Been told he snores, mouth is dry in the morning, some morning headaches, normal dreams.  Sometimes watches TV to fall asleep, no late-night exercising, some late-night eating.  Migraines: sometimes more frequent with heat, fall, and spring. One migraine every 10 days, sumatriptan  stops them well.  Rest of ROS was negative.     03/15/2024    9:42 AM 03/09/2024    9:18 AM 05/21/2023    8:51 AM  PHQ9 SCORE ONLY  PHQ-9 Total Score 9 1  5       Data saved with a previous flowsheet row definition     OBJECTIVE:   BP 134/84   Pulse (!) 59   Ht 5' 8 (1.727 m)   Wt 170 lb 3.2 oz (77.2 kg)   SpO2 93%   BMI 25.88 kg/m     Head: Normocephalic, no asymmetry or ecchymosis  Eyes: PERRLA, EOM intact and normal  Ears: Ear canal clear on right, large glob of cerumen not impacting TM in left, TM clear and translucent, nonbulging and  nonerythematous. Nose: Patent bilaterally, non erythematous, no deviation seen Throat: no ulcerations or lesions, teeth well-appearing with no visible cavities or caries, oropharynx non erythematous, tonsils present and not enlarged NECK: Supple, full ROM, no LAD, non-tender, thyroid  normal in size with no nodules palpated Cardiac: Regular rate and rhythm. Normal S1/S2. No murmurs, rubs, or gallops appreciated. Lungs: Clear bilaterally to ascultation.  Abdomen: Normoactive bowel sounds. No tenderness to deep or light palpation. No rebound or guarding.   Psych: Pleasant and appropriate    ASSESSMENT/PLAN:   Assessment & Plan Lumbar back pain with radiculopathy affecting left lower extremity Okay controlled, on chronic Flexeril  taking nightly, will discuss tapering and discontinuing this in the future. History of gastric ulcer Well-controlled, no bleeding or symptoms over the last several decades.  Reports that he had previously tried Pepcid  but was not sure of the dose and stated his reflux was a little worse. -Identify and reduce food triggers -Discontinue esomeprazole  -Start Pepcid  40 mg once a day Screening for diabetes mellitus A1c 5.6 today, no concern for diabetes Routine screening for STI (sexually transmitted infection) Ordered hep B panel and RPR Insomnia, unspecified type Ongoing issue, but worse over the last 3 months.  Okay sleep hygiene, some areas to improve.  Some red flag symptoms for OSA including morning headaches, dry throat, and snoring.  Extensive discussion regarding  the benefits of sleep studies but patient wanted to look into that more.  PHQ recently increased to 9, upon further evaluation low concern for depression. -1 month follow-up to discuss -Sleep hygiene information given Restless leg syndrome Will consider discontinuing the Requip , but currently moderately well-controlled. -Discussed switching Requip  to gabapentin  Rhinitis medicamentosa Chronic Afrin use  over the last 20 years, requiring 2 to 4 sprays a day.  Worse congestion in the morning, uses Afrin at night.  History of previous dependency on Vicks Synex menthol.  Extensive patient counseling and education given regarding chronic Afrin use. -Discontinue Afrin -Continue allergy regimen -Nasal saline rinses as needed Annual Examination  See AVS for age appropriate recommendations.  PHQ score 9, reviewed and discussed.  Blood pressure value is at goal, discussed.   Considered the following screening exams based upon USPSTF recommendations: Diabetes screening: discussed Screening for elevated cholesterol: discussed HIV testing: done Hepatitis C: discussed Hepatitis B: ordered Syphilis if at high risk: ordered Reviewed risk factors for latent tuberculosis and not indicated Colorectal cancer screening: up to date on screening for CRC. Lung cancer screening: Not needed See documentation below regarding discussion and indication.  PSA discussed and after engaging in discussion of possible risks, benefits and complications of screening patient elected to not get PSA checked.   Follow up in 1 month or sooner if indicated.  MyChart Activation: Already signed up   Fairy Amy, MD [facility]

## 2024-03-15 NOTE — Patient Instructions (Addendum)
 Thank you for visiting the clinic today, it was good to see you!  Please always bring your medication bottles  In today's visit we discussed:  Sinus congestion: I suspect that this is largely secondary to use of the Afrin.  Discontinue this and continue your allergy regimen.  You can also use a nasal neti pot to irrigate the sinuses if they feel congested.  The next 3 to 4 days your congestion will be very bad, but hopefully it will gradually improve.  Sleep: I suspect this may be related to a few things including sleep hygiene as well as possible sleep apnea.  Consider getting the sleep test whether at home or at the sleep center.  It also may be related to the Afrin use, so we will have you come back in about a month to see how sleep is changed.  Try to appointment the suggestions below  Reflux: I have discontinued you esomeprazole , and started Pepcid  40 mg once a day.  If this is not working effectively we can go back, but try to also identify food triggers and cut those out.  Common triggers include spicy food, caffeine, and soda.  Sleep: What is sleep hygiene? Sleep hygiene is the term doctors use for habits that help you get good-quality sleep. Good-quality sleep means getting sleep that is long enough and is restful. The things we do throughout the day and before bed can affect how well we sleep. Sometimes, we do things that might make our sleep worse without realizing it. Good sleep hygiene is about understanding how to get better sleep.  Why is sleep important?  You need sleep to feel awake during the day and to be alert enough to do your normal activities. It's also important for keeping your body healthy. For example, sleep: ?Helps you learn information and form memories - As you sleep, the brain processes and stores information and experiences from when you were awake. ?Can help lower your risk of getting sick, or help you get better faster if you are sick ?Helps babies and children  grow - This is because the body releases more growth hormone during sleep than during the day. ?Helps children and adults build muscles and repair cells and tissues in the body Not getting enough sleep can have negative effects. These can include: ?Feeling irritable, anxious, or depressed ?Relationship problems, especially for children and teens ?An increased risk of high blood pressure, heart disease, stroke, kidney disease, obesity, and type 2 diabetes  How much sleep do I need?  It depends on your age, lifestyle, and general health. Your doctor or nurse can help you understand exactly how much sleep you or your child needs. The general recommendations for sleep are: ?Newborns (0 to 3 months) - 14 to 17 hours a day ?Infants (4 to 11 months) - 12 to 15 hours a day ?Toddlers (1 to 2 years) - 11 to 14 hours a day ?Preschool-aged children (3 to 5 years) - 10 to 13 hours a day ?School-aged children (6 to 13 years) - 9 to 11 hours a day ?Teens (14 to 17 years) - 8 to 10 hours a day ?Adults (26 to 64 years) - 7 to 9 hours a day ?Older adults (65 years or older) - 7 to 8 hours a day  If you do not get enough sleep each night, you build up a sleep debt. For example, if a school-aged child gets 8 hours of sleep instead of 10, they have a sleep  debt of 2 hours. People with a sleep debt might need more than the recommended number of hours of sleep until they catch up. Catching up on sleep can be difficult. The healthiest thing to do is to sleep the recommended number of hours each night. But if this is not possible because of your job or other responsibilities, try to catch up on sleep when you can. Remember that the length of sleep is not the only thing that makes for good sleep. The quality of sleep is just as important as the number of hours. This is because your brain and body move through sleep cycles as you sleep. Each cycle has a different purpose. If you do not sleep deeply, or if you wake up  often throughout the night, you might not get enough time in each sleep cycle.  How can I get better sleep?  Having good sleep hygiene means that you: ?Go to bed and get up at about the same time every day. ?Have coffee, tea, and other drinks or foods with caffeine only in the morning. ?Avoid eating close to bedtime - Try not to eat a large meal soon before you go to bed. It is better to eat a healthy and filling (but not too heavy) meal in the early evening. Try to avoid late-night snacking. ?Avoid alcohol in the late afternoon, evening, and bedtime. ?Avoid smoking, especially in the evening. ?Keep your bedroom dark, cool, quiet, and free of reminders of work or other things that cause you stress. ?Try to solve problems before you go to bed. ?Get plenty of physical activity, but not right before bed - Exercising 4 to 6 hours before bedtime has the best impact on sleep. ?Avoid looking at phones or reading devices (e-books) that give off light before bed - This can make it harder to fall asleep. There is not good evidence that wearing special blue light-filtering glasses works to improve sleep. ?Keep the place where you sleep quiet and dark - If needed, you can use a white noise machine or ear plugs to block out sound. Blackout curtains or an eye mask can help block out light. ?Do not check the time at night - Try to keep alarm clocks, watches, or smartphones out of your line of sight. Checking the time in the middle of the night can make you feel more awake, and can make it hard to fall back asleep. ?Avoid long naps if you have trouble sleeping at night, especially in the late afternoon. Short naps (about 20 minutes) can be helpful, especially if your work schedule changes day to day and you need to be alert at different times.  What if I am having trouble sleeping?  Everyone has a bad night of sleep sometimes. But if you regularly have trouble with sleep, see your doctor or nurse. They can  check to see if you have a sleep disorder. Then, they can work with you to treat the problem. Call for advice if you or your child: ?Regularly do not get the recommended amount of sleep ?Have a lot of trouble waking up in the morning ?Feel tired or have trouble focusing during the day ?Wake up often throughout the night and can't get back to sleep ?Have a lot of trouble falling asleep at night ?Have other problems related to or caused by sleep   Other [facility] that offer affordable prices on both prescriptions and over-the-counter items, as well as convenient services like vaccinations, are  [facility], at [facility]         Address:  [address] [place] [address], [place], [place] [address]         Phone: [phone]  [facility], located in the [facility]        Address: [address], [place], [place] [address]        Phone: [phone]  [facility], at [facility]       Address: [address], [place], [place] [address]       Phone: [phone]  [facility], at [facility]       Address: [address], [place], [place] [address]       Phone: [phone]   Please follow-up in 3 months  For any questions, please call the office at [phone] or send me a message in MyChart. Have a great day!  -Fairy Amy, MD  [facility] Resident, PGY-1

## 2024-03-15 NOTE — Assessment & Plan Note (Signed)
 Okay controlled, on chronic Flexeril  taking nightly, will discuss tapering and discontinuing this in the future.

## 2024-03-15 NOTE — Assessment & Plan Note (Signed)
 Will consider discontinuing the Requip , but currently moderately well-controlled. -Discussed switching Requip  to gabapentin 

## 2024-03-16 ENCOUNTER — Ambulatory Visit: Payer: Self-pay

## 2024-03-16 LAB — CBC
Hematocrit: 48.2 % (ref 37.5–51.0)
Hemoglobin: 15.8 g/dL (ref 13.0–17.7)
MCH: 29.9 pg (ref 26.6–33.0)
MCHC: 32.8 g/dL (ref 31.5–35.7)
MCV: 91 fL (ref 79–97)
Platelets: 123 x10E3/uL — ABNORMAL LOW (ref 150–450)
RBC: 5.29 x10E6/uL (ref 4.14–5.80)
RDW: 12.2 % (ref 11.6–15.4)
WBC: 4.7 x10E3/uL (ref 3.4–10.8)

## 2024-03-16 LAB — HEPATITIS B SURFACE ANTIBODY, QUANTITATIVE: Hepatitis B Surf Ab Quant: 10.3 m[IU]/mL

## 2024-03-16 LAB — RPR: RPR Ser Ql: NONREACTIVE

## 2024-03-16 LAB — FERRITIN: Ferritin: 63 ng/mL (ref 30–400)

## 2024-03-16 LAB — HEPATITIS B SURFACE ANTIGEN: Hepatitis B Surface Ag: NEGATIVE

## 2024-03-16 LAB — TSH RFX ON ABNORMAL TO FREE T4: TSH: 1.28 u[IU]/mL (ref 0.450–4.500)

## 2024-03-16 LAB — HEPATITIS B CORE ANTIBODY, TOTAL: Hep B Core Total Ab: NEGATIVE

## 2024-05-12 ENCOUNTER — Other Ambulatory Visit: Payer: Self-pay

## 2024-05-12 DIAGNOSIS — G43819 Other migraine, intractable, without status migrainosus: Secondary | ICD-10-CM

## 2024-05-12 MED ORDER — SUMATRIPTAN SUCCINATE 100 MG PO TABS: 100.0000 mg | ORAL_TABLET | ORAL | 3 refills | Status: DC | PRN

## 2024-05-12 NOTE — Telephone Encounter (Signed)
 Chart reviewed  -Fairy Amy, MD

## 2024-05-22 ENCOUNTER — Telehealth: Payer: Self-pay

## 2024-05-22 NOTE — Telephone Encounter (Signed)
 Patient calls nurse line for (2) concerns.   He reports he saw on google that there is an open recall on Atorvastatin . He reports PCPs thoughts on this.   He also reports he has stopped taking Famotidine  and has started taking Nexium  OTC. He reports Nexium  simply works better.  Advised will forward to PCP.

## 2024-06-08 ENCOUNTER — Other Ambulatory Visit: Payer: Self-pay

## 2024-06-08 DIAGNOSIS — R39198 Other difficulties with micturition: Secondary | ICD-10-CM

## 2024-06-08 MED ORDER — TAMSULOSIN HCL 0.4 MG PO CAPS: 0.8000 mg | ORAL_CAPSULE | Freq: Every day | ORAL | 1 refills | Status: AC

## 2024-06-08 MED ORDER — LISINOPRIL 20 MG PO TABS: 20.0000 mg | ORAL_TABLET | Freq: Every day | ORAL | 1 refills | Status: AC

## 2024-06-08 NOTE — Telephone Encounter (Signed)
 Chart reviewed  -Fairy Amy, MD

## 2024-06-15 ENCOUNTER — Other Ambulatory Visit: Payer: Self-pay

## 2024-06-15 DIAGNOSIS — Z8711 Personal history of peptic ulcer disease: Secondary | ICD-10-CM

## 2024-06-15 NOTE — Telephone Encounter (Signed)
 Chart reviewed  -Fairy Amy, MD

## 2024-07-07 ENCOUNTER — Other Ambulatory Visit: Payer: Self-pay

## 2024-07-07 DIAGNOSIS — M5416 Radiculopathy, lumbar region: Secondary | ICD-10-CM

## 2024-07-07 NOTE — Telephone Encounter (Signed)
 Chart reviewed, would like to follow-up regarding patient's sleep at within 30 days.  -Fairy Amy, MD

## 2024-07-18 ENCOUNTER — Encounter: Payer: Self-pay | Admitting: Family Medicine

## 2024-07-18 ENCOUNTER — Ambulatory Visit: Admitting: Family Medicine

## 2024-07-18 VITALS — BP 132/81 | HR 67 | Ht 68.0 in | Wt 172.5 lb

## 2024-07-18 DIAGNOSIS — Z23 Encounter for immunization: Secondary | ICD-10-CM | POA: Diagnosis not present

## 2024-07-18 DIAGNOSIS — R399 Unspecified symptoms and signs involving the genitourinary system: Secondary | ICD-10-CM | POA: Diagnosis not present

## 2024-07-18 LAB — POCT URINALYSIS DIP (MANUAL ENTRY)
Bilirubin, UA: NEGATIVE
Blood, UA: NEGATIVE
Glucose, UA: NEGATIVE mg/dL
Ketones, POC UA: NEGATIVE mg/dL
Nitrite, UA: NEGATIVE
Protein Ur, POC: NEGATIVE mg/dL
Spec Grav, UA: 1.02
Urobilinogen, UA: 0.2 U/dL
pH, UA: 5.5

## 2024-07-18 LAB — POCT UA - MICROSCOPIC ONLY

## 2024-07-18 NOTE — Patient Instructions (Signed)
 Good to see you today - Thank you for coming in  Things we discussed today:  I would recommend calling your urologist regarding your urinary symptoms. I am sending a urine culture to see if that grows any bacteria.

## 2024-07-18 NOTE — Progress Notes (Signed)
" ° ° °  SUBJECTIVE:   CHIEF COMPLAINT / HPI:   Tingling with urination for 2 weeks, intermittent Some urinary urgency  No dysuria, hematuria, abd pain, fever  PERTINENT  PMH / PSH: BPH, TURBT 07/2023 (benign pathology, showed inflammation cystitis, post-traumatic bulbous urethral stricture), hx E coli UTI (most recent 03/2023)  OBJECTIVE:   BP 132/81   Pulse 67   Ht 5' 8 (1.727 m)   Wt 172 lb 8 oz (78.2 kg)   SpO2 95%   BMI 26.23 kg/m   Gen: well appearing, NAD Resp: Breathing comfortably on RA Psych: normal affect  ASSESSMENT/PLAN:   Assessment & Plan UTI symptoms Urine dipstick negative for infection. Culture sent Advised to see urology, he is established with them, for possible further workup given this is not an infectious cause ?related to urethral stricture Encounter for immunization Covid vaccine given     Emberley Kral, DO [facility] "

## 2024-07-22 ENCOUNTER — Ambulatory Visit: Payer: Self-pay | Admitting: Family Medicine

## 2024-07-23 LAB — URINE CULTURE

## 2024-07-23 MED ORDER — AMOXICILLIN 875 MG PO TABS: 875.0000 mg | ORAL_TABLET | Freq: Two times a day (BID) | ORAL | 0 refills | Status: DC

## 2024-07-24 ENCOUNTER — Other Ambulatory Visit: Payer: Self-pay

## 2024-07-24 DIAGNOSIS — E782 Mixed hyperlipidemia: Secondary | ICD-10-CM

## 2024-07-24 MED ORDER — ATORVASTATIN CALCIUM 20 MG PO TABS: 20.0000 mg | ORAL_TABLET | Freq: Every day | ORAL | 3 refills | Status: AC

## 2024-07-24 NOTE — Telephone Encounter (Signed)
 Chart reviewed, will need repeat BMP next visit  -Fairy Amy, MD

## 2024-08-07 ENCOUNTER — Ambulatory Visit: Payer: Self-pay

## 2024-08-07 VITALS — BP 128/84 | HR 62 | Ht 68.0 in | Wt 171.2 lb

## 2024-08-07 DIAGNOSIS — Z8711 Personal history of peptic ulcer disease: Secondary | ICD-10-CM

## 2024-08-07 DIAGNOSIS — G2581 Restless legs syndrome: Secondary | ICD-10-CM

## 2024-08-07 DIAGNOSIS — Z87440 Personal history of urinary (tract) infections: Secondary | ICD-10-CM

## 2024-08-07 MED ORDER — ESOMEPRAZOLE MAGNESIUM 20 MG PO CPDR: 20.0000 mg | DELAYED_RELEASE_CAPSULE | Freq: Every day | ORAL | 1 refills | Status: AC

## 2024-08-07 NOTE — Assessment & Plan Note (Signed)
 Currently stable on treatment of Flexeril  and ropinirole , not interested in modification of therapy to gabapentin  at this time.

## 2024-08-07 NOTE — Assessment & Plan Note (Signed)
 Tried transition to famotidine  from Nexium  and patient had recurrence of symptoms, on lowest dose of Nexium .  Reports this has been the only PPI that improves his GERD symptoms. Resume Nexium  20 mg daily DC famotidine  Consider DEXA scan due to increased risk of osteoporosis

## 2024-08-07 NOTE — Progress Notes (Signed)
" ° ° °  SUBJECTIVE:   CHIEF COMPLAINT / HPI:   UTI: Completed the full course of amoxicillin , no more urinary symptoms. No hematuria. Had 2 UTI's in 2024 and this was his only infection last year. Sees urology yearly and will be due to be seen this summer.  Restless leg syndrome: Reports tightness and difficulty to relaxing at night. Symptoms improve with movement.  GERD: Had worsening reflux after switching to famotidine  and would like to continue Nexium .  PERTINENT  PMH / PSH: Transurethral resection of bladder tumor, CAD  OBJECTIVE:   BP 128/84   Pulse 62   Ht 5' 8 (1.727 m)   Wt 171 lb 3.2 oz (77.7 kg)   SpO2 97%   BMI 26.03 kg/m    Cardiac: Regular rate and rhythm. Normal S1/S2. No murmurs, rubs, or gallops appreciated. Lungs: Clear bilaterally to ascultation.  Abdomen: Normoactive bowel sounds. No tenderness to deep or light palpation. No rebound or guarding.  Psych: Pleasant and appropriate    ASSESSMENT/PLAN:   Assessment & Plan History of UTI Symptoms improved after amoxicillin , reports 2 UTIs over 2024, here for follow-up of 1 UTI from 2025.  History of TURBT, follows with urology and scheduled for follow-up in July.  No urinary symptoms. If repeat UTI in the next 12 months will contact [facility] for earlier follow-up for other treatment of small bulbar stx. History of gastric ulcer Tried transition to famotidine  from Nexium  and patient had recurrence of symptoms, on lowest dose of Nexium .  Reports this has been the only PPI that improves his GERD symptoms. Resume Nexium  20 mg daily DC famotidine  Consider DEXA scan due to increased risk of osteoporosis Restless leg syndrome Currently stable on treatment of Flexeril  and ropinirole , not interested in modification of therapy to gabapentin  at this time.     Fairy Amy, MD [facility] "

## 2024-08-07 NOTE — Patient Instructions (Signed)
 Thank you for visiting the clinic today, it was good to see you!  Please always bring your medication bottles  In today's visit we discussed:  UTI: Please continue following up with urology, your next appointment should be around July of this year.  If you have recurrent symptoms of an infection such as burning when you urinate, change in urinary urgency or frequency, or feverish with no other clear viral cause these are concerns for scheduling a follow-up visit.  GERD/history of gastric ulcer: I have refilled your Nexium , continue to take as previously prescribed once daily 20 mg.  Restless leg syndrome: It may be worth considering transitioning from the Flexeril  and ropinirole  to taking once nightly gabapentin .  We can discuss this at a later visit.  Please follow-up in 6 months  For any questions, please call the office at [phone] or send me a message in MyChart. Have a great day!  -Fairy Amy, MD  [facility] Resident, PGY-1

## 2024-08-08 ENCOUNTER — Other Ambulatory Visit: Payer: Self-pay | Admitting: Family Medicine

## 2024-08-09 NOTE — Telephone Encounter (Signed)
 Visited with patient 08/07/2024  Dr. Fairy Amy, MD [facility] Resident, PGY-1

## 2024-08-11 ENCOUNTER — Telehealth: Payer: Self-pay

## 2024-08-11 DIAGNOSIS — N390 Urinary tract infection, site not specified: Secondary | ICD-10-CM

## 2024-08-11 DIAGNOSIS — Z8603 Personal history of neoplasm of uncertain behavior: Secondary | ICD-10-CM

## 2024-08-11 NOTE — Telephone Encounter (Signed)
 Patient calls nurse line in regards to UTI.   He reports he finished the antibiotic and reports he felt well for ~ 1 week. However, he reports yesterday he started having dysuria again. He reports he took AZO, however the symptoms have persisted.   He denies any fevers, chills, abdominal pain or nausea/vomiting. He denies any blood or abnormal odors or color.   Patient advised to reach out to Urologist, per PCP note.   Advised will forward to PCP as well.   Precautions discussed with patient.

## 2024-08-12 ENCOUNTER — Other Ambulatory Visit: Payer: Self-pay

## 2024-08-12 DIAGNOSIS — G43819 Other migraine, intractable, without status migrainosus: Secondary | ICD-10-CM

## 2024-08-14 NOTE — Telephone Encounter (Signed)
 Patient returns call to nurse line. He reports that he was able to see provider at [facility] today. They changed his antibiotics to Bactrim  and he is supposed to follow up in April.   However, per new insurance guidelines, he needs a referral to be placed so that he can continue care at [facility].   Will forward to PCP for referral placement.   Chiquita JAYSON English, RN

## 2024-08-14 NOTE — Telephone Encounter (Signed)
 Attempted to call patient, however no answer.   VM left asking him to call the office to schedule with PCP for repeat UA.   Please assist in scheduling when he calls back.

## 2024-08-14 NOTE — Telephone Encounter (Signed)
 Chart reviewed  -Fairy Amy, MD

## 2024-08-15 NOTE — Telephone Encounter (Signed)
 See other telephone note.

## 2024-09-01 ENCOUNTER — Other Ambulatory Visit: Payer: Self-pay

## 2024-09-01 NOTE — Telephone Encounter (Signed)
 Chart reviewed, requested to adjust dispense amount.  -Fairy Amy, MD
